# Patient Record
Sex: Female | Born: 1940 | Race: White | Hispanic: No | Marital: Married | Smoking: Former smoker
Health system: Southern US, Community
[De-identification: ages and names within clinical notes are randomized; demographics above are authoritative.]

## PROBLEM LIST (undated history)

## (undated) DIAGNOSIS — K2100 Gastro-esophageal reflux disease with esophagitis, without bleeding: Secondary | ICD-10-CM

## (undated) DIAGNOSIS — T8859XA Other complications of anesthesia, initial encounter: Secondary | ICD-10-CM

## (undated) DIAGNOSIS — M2550 Pain in unspecified joint: Secondary | ICD-10-CM

## (undated) DIAGNOSIS — E559 Vitamin D deficiency, unspecified: Secondary | ICD-10-CM

## (undated) DIAGNOSIS — Z8719 Personal history of other diseases of the digestive system: Secondary | ICD-10-CM

## (undated) DIAGNOSIS — R05 Cough: Secondary | ICD-10-CM

## (undated) DIAGNOSIS — M549 Dorsalgia, unspecified: Secondary | ICD-10-CM

## (undated) DIAGNOSIS — T4145XA Adverse effect of unspecified anesthetic, initial encounter: Secondary | ICD-10-CM

## (undated) DIAGNOSIS — J189 Pneumonia, unspecified organism: Secondary | ICD-10-CM

## (undated) DIAGNOSIS — R079 Chest pain, unspecified: Secondary | ICD-10-CM

## (undated) DIAGNOSIS — R7303 Prediabetes: Secondary | ICD-10-CM

## (undated) DIAGNOSIS — M1990 Unspecified osteoarthritis, unspecified site: Secondary | ICD-10-CM

## (undated) DIAGNOSIS — M65979 Unspecified synovitis and tenosynovitis, unspecified ankle and foot: Secondary | ICD-10-CM

## (undated) DIAGNOSIS — M659 Synovitis and tenosynovitis, unspecified: Secondary | ICD-10-CM

## (undated) DIAGNOSIS — F329 Major depressive disorder, single episode, unspecified: Secondary | ICD-10-CM

## (undated) DIAGNOSIS — I701 Atherosclerosis of renal artery: Secondary | ICD-10-CM

## (undated) DIAGNOSIS — I2510 Atherosclerotic heart disease of native coronary artery without angina pectoris: Secondary | ICD-10-CM

## (undated) DIAGNOSIS — IMO0001 Reserved for inherently not codable concepts without codable children: Secondary | ICD-10-CM

## (undated) DIAGNOSIS — E039 Hypothyroidism, unspecified: Secondary | ICD-10-CM

## (undated) DIAGNOSIS — I10 Essential (primary) hypertension: Secondary | ICD-10-CM

## (undated) DIAGNOSIS — Z8489 Family history of other specified conditions: Secondary | ICD-10-CM

## (undated) DIAGNOSIS — E669 Obesity, unspecified: Secondary | ICD-10-CM

## (undated) DIAGNOSIS — H9190 Unspecified hearing loss, unspecified ear: Secondary | ICD-10-CM

## (undated) DIAGNOSIS — D696 Thrombocytopenia, unspecified: Secondary | ICD-10-CM

## (undated) DIAGNOSIS — R5383 Other fatigue: Secondary | ICD-10-CM

## (undated) DIAGNOSIS — M069 Rheumatoid arthritis, unspecified: Secondary | ICD-10-CM

## (undated) DIAGNOSIS — R011 Cardiac murmur, unspecified: Secondary | ICD-10-CM

## (undated) DIAGNOSIS — G4700 Insomnia, unspecified: Secondary | ICD-10-CM

## (undated) DIAGNOSIS — D649 Anemia, unspecified: Secondary | ICD-10-CM

## (undated) DIAGNOSIS — K219 Gastro-esophageal reflux disease without esophagitis: Secondary | ICD-10-CM

## (undated) DIAGNOSIS — R059 Cough, unspecified: Secondary | ICD-10-CM

## (undated) DIAGNOSIS — R413 Other amnesia: Secondary | ICD-10-CM

## (undated) DIAGNOSIS — M79673 Pain in unspecified foot: Secondary | ICD-10-CM

## (undated) DIAGNOSIS — F3289 Other specified depressive episodes: Secondary | ICD-10-CM

## (undated) DIAGNOSIS — E785 Hyperlipidemia, unspecified: Secondary | ICD-10-CM

## (undated) DIAGNOSIS — N3941 Urge incontinence: Secondary | ICD-10-CM

## (undated) DIAGNOSIS — E079 Disorder of thyroid, unspecified: Secondary | ICD-10-CM

## (undated) HISTORY — DX: Atherosclerotic heart disease of native coronary artery without angina pectoris: I25.10

## (undated) HISTORY — DX: Anemia, unspecified: D64.9

## (undated) HISTORY — DX: Urge incontinence: N39.41

## (undated) HISTORY — PX: HERNIA REPAIR: SHX51

## (undated) HISTORY — PX: CHOLECYSTECTOMY: SHX55

## (undated) HISTORY — DX: Reserved for inherently not codable concepts without codable children: IMO0001

## (undated) HISTORY — DX: Pain in unspecified foot: M79.673

## (undated) HISTORY — DX: Other specified depressive episodes: F32.89

## (undated) HISTORY — DX: Disorder of thyroid, unspecified: E07.9

## (undated) HISTORY — DX: Insomnia, unspecified: G47.00

## (undated) HISTORY — DX: Dorsalgia, unspecified: M54.9

## (undated) HISTORY — DX: Unspecified synovitis and tenosynovitis, unspecified ankle and foot: M65.979

## (undated) HISTORY — DX: Major depressive disorder, single episode, unspecified: F32.9

## (undated) HISTORY — DX: Unspecified osteoarthritis, unspecified site: M19.90

## (undated) HISTORY — DX: Thrombocytopenia, unspecified: D69.6

## (undated) HISTORY — DX: Pain in unspecified joint: M25.50

## (undated) HISTORY — DX: Prediabetes: R73.03

## (undated) HISTORY — DX: Hyperlipidemia, unspecified: E78.5

## (undated) HISTORY — PX: ABDOMINAL HYSTERECTOMY: SHX81

## (undated) HISTORY — DX: Cough: R05

## (undated) HISTORY — PX: CARDIAC CATHETERIZATION: SHX172

## (undated) HISTORY — DX: Obesity, unspecified: E66.9

## (undated) HISTORY — DX: Vitamin D deficiency, unspecified: E55.9

## (undated) HISTORY — DX: Chest pain, unspecified: R07.9

## (undated) HISTORY — DX: Cough, unspecified: R05.9

## (undated) HISTORY — DX: Gastro-esophageal reflux disease with esophagitis, without bleeding: K21.00

## (undated) HISTORY — DX: Atherosclerosis of renal artery: I70.1

## (undated) HISTORY — DX: Other amnesia: R41.3

## (undated) HISTORY — PX: TOTAL HIP ARTHROPLASTY: SHX124

## (undated) HISTORY — DX: Other fatigue: R53.83

## (undated) HISTORY — DX: Synovitis and tenosynovitis, unspecified: M65.9

## (undated) HISTORY — PX: BACK SURGERY: SHX140

## (undated) HISTORY — DX: Essential (primary) hypertension: I10

## (undated) HISTORY — PX: SPINE SURGERY: SHX786

## (undated) HISTORY — DX: Gastro-esophageal reflux disease with esophagitis: K21.0

---

## 2010-06-25 HISTORY — PX: JOINT REPLACEMENT: SHX530

## 2012-01-31 ENCOUNTER — Encounter: Payer: Self-pay | Admitting: Surgery

## 2012-02-08 ENCOUNTER — Encounter: Payer: Self-pay | Admitting: Surgery

## 2012-02-11 ENCOUNTER — Encounter: Payer: Self-pay | Admitting: Surgery

## 2012-02-11 ENCOUNTER — Ambulatory Visit (INDEPENDENT_AMBULATORY_CARE_PROVIDER_SITE_OTHER): Payer: Medicare HMO | Admitting: Surgery

## 2012-02-11 VITALS — BP 155/75 | HR 58 | Resp 16 | Ht 61.0 in | Wt 187.0 lb

## 2012-02-11 DIAGNOSIS — I701 Atherosclerosis of renal artery: Secondary | ICD-10-CM

## 2012-02-11 NOTE — Progress Notes (Signed)
Vascular and Vein Specialist of [facility]   Patient name: Jennifer Austin MRN: [id number] DOB: May 24, 1941 Sex: female   Referred by: Dr.  Shary Decamp  Reason for referral:  Chief Complaint  Patient presents with  . New Evaluation    renal artery stenosis/Dr. Myra Rude    HISTORY OF PRESENT ILLNESS: The patient comes in today for evaluation of left renal artery stenosis. During a workup for hip replacement surgery she was found to have a creatinine of 1.3 as well as a GFR of 40. The patient has a history of hypertension for several years. This has been relatively well controlled. She is also a diabetic which is diet controlled. The patient reports having had issues with her kidneys during pregnancy. She was told that she had an inflamed kidney with all of her children. The patient was sent for a renal artery duplex which revealed elevated velocities within the left renal artery. Peak systolic velocity was 313 cm/s with a renal ratio of 5.6. The resistive indices on the left is 0.74. The patient has a history of smoking but quit 12 years ago.  Past Medical History  Diagnosis Date  . Renal artery stenosis   . Arthralgia   . Back ache   . Obesity   . Chest pain, unspecified   . Reflux esophagitis   . CAD (coronary artery disease)   . Cough   . Depressive disorder, not elsewhere classified   . Hyperlipidemia     Dyslipidemia  . Hypertension   . Fatigue   . Foot pain   . Thyroid disease     Hypothyroidsm  . Insomnia   . Anemia     Iron deficiency  . Memory loss   . Myalgia and myositis, unspecified   . Arthritis     osteoarthritis  . Prediabetes   . Tenosynovitis of foot and ankle   . Thrombocytopenia, unspecified   . Urge incontinence   . Unspecified vitamin D deficiency     Past Surgical History  Procedure Date  . Total hip arthroplasty 4098,1191    X2    History   Social History  . Marital Status: Married    Spouse Name: N/A    Number of Children: N/A  . Years of  Education: N/A   Occupational History  . Not on file.   Social History Main Topics  . Smoking status: Former Smoker    Types: Cigarettes    Quit date: 02/11/2000  . Smokeless tobacco: Never Used  . Alcohol Use: No  . Drug Use: No  . Sexually Active: Not on file   Other Topics Concern  . Not on file   Social History Narrative  . No narrative on file    Family History  Problem Relation Age of Onset  . Diabetes Mother   . Heart disease Mother     before age 39  . Heart attack Mother   . Other Mother     amputation  . Cancer Sister   . Cancer Brother     Allergies as of 02/11/2012 - Review Complete 02/11/2012  Allergen Reaction Noted  . Morphine sulfate Nausea And Vomiting 01/31/2012    Current Outpatient Prescriptions on File Prior to Visit  Medication Sig Dispense Refill  . aspirin 81 MG tablet Take 81 mg by mouth daily.      . Cholecalciferol (VITAMIN D3) 3000 UNITS TABS Take 5,000 Units by mouth once a week.      . levothyroxine (SYNTHROID, LEVOTHROID) 137  MCG tablet Take 137 mcg by mouth daily.      Marland Kitchen LOSARTAN POTASSIUM-HCTZ PO Take 50 mg by mouth daily.      . Multiple Vitamin (MULTIVITAMIN) tablet Take 1 tablet by mouth daily.      . nitroGLYCERIN (NITROSTAT) 0.4 MG SL tablet Place 0.4 mg under the tongue every 5 (five) minutes as needed.      Marland Kitchen omeprazole (PRILOSEC) 20 MG capsule Take 20 mg by mouth daily.      . pravastatin (PRAVACHOL) 80 MG tablet Take 80 mg by mouth daily.      . sertraline (ZOLOFT) 50 MG tablet Take 50 mg by mouth daily.      Marland Kitchen zolpidem (AMBIEN) 10 MG tablet Take 10 mg by mouth at bedtime as needed.         REVIEW OF SYSTEMS: Cardiovascular: No chest pain, chest pressure, palpitations, orthopnea, or dyspnea on exertion. No claudication or rest pain,  No history of DVT or phlebitis. Pulmonary: No productive cough, asthma or wheezing. Neurologic: No weakness, paresthesias, aphasia, or amaurosis. No dizziness. Hematologic: No bleeding  problems or clotting disorders. Musculoskeletal: Left hip pain Gastrointestinal: No blood in stool or hematemesis Genitourinary: No dysuria or hematuria. Psychiatric:: No history of major depression. Integumentary: No rashes or ulcers. Constitutional: No fever or chills.  PHYSICAL EXAMINATION: General: The patient appears their stated age.  Vital signs are BP 155/75  Pulse 58  Resp 16  Ht 5\' 1"  (1.549 m)  Wt 187 lb (84.823 kg)  BMI 35.33 kg/m2  SpO2 98% HEENT:  No gross abnormalities Pulmonary: Respirations are non-labored Abdomen: Soft and non-tender . Aorta is not palpable. There are no abdominal bruits. Musculoskeletal: There are no major deformities.   Neurologic: No focal weakness or paresthesias are detected, Skin: There are no ulcer or rashes noted. Psychiatric: The patient has normal affect. Cardiovascular: There is a regular rate and rhythm without significant murmur appreciated. No carotid bruits. Palpable pedal pulses.  Diagnostic Studies: I have reviewed her outside renal artery ultrasound which shows greater than 60% left renal artery stenosis. Peak systolic velocity of 313 cm/s with a renal aortic ratio of 5.8. Resistive indices is 0.74  Outside Studies/Documentation Historical records were reviewed.  They showed left renal artery stenosis  Medication Changes: None  Assessment:  Left renal artery stenosis Plan: I discussed with the patient today the significance of the ultrasound findings. I discussed that the main reason to further evaluate and possibly treat this problem would be for uncontrolled hypertension and worsening renal function. The patient's blood pressure appears to be relatively well controlled. The last kidney function I have is a creatinine of 1.3 and a GFR of 40. By ultrasound with a resistive indices of 0.74 she does have some intrinsic kidney disease that would not be corrected with treatment of the renal artery stenosis. I suspect that with her  velocity profile that her stenosis is in the 60-70% range and without uncontrolled blood pressure and worsening renal function of the reluctant to stay in this area as the data on this situation is mixed. In addition if she were to have a stent placed it would require a minimum of 6 weeks to 3 months of Plavix. This would delay her hip replacement surgery. Therefore, at this point I feel she can proceed from my perspective with her hip replacement and have her renal artery stenosis followed up again in 6 months with a repeat ultrasound.     Jorge Ny, M.D.  [facility] of [place] Office: [phone] Pager:  [phone]

## 2012-02-24 HISTORY — PX: JOINT REPLACEMENT: SHX530

## 2012-08-13 ENCOUNTER — Encounter: Payer: Self-pay | Admitting: Neurosurgery

## 2012-08-14 ENCOUNTER — Other Ambulatory Visit (INDEPENDENT_AMBULATORY_CARE_PROVIDER_SITE_OTHER): Payer: Medicare Other | Admitting: *Deleted

## 2012-08-14 ENCOUNTER — Ambulatory Visit (INDEPENDENT_AMBULATORY_CARE_PROVIDER_SITE_OTHER): Payer: Medicare Other | Admitting: Neurosurgery

## 2012-08-14 ENCOUNTER — Encounter: Payer: Self-pay | Admitting: Neurosurgery

## 2012-08-14 VITALS — BP 126/63 | HR 53 | Resp 16 | Ht 61.0 in | Wt 180.0 lb

## 2012-08-14 DIAGNOSIS — I10 Essential (primary) hypertension: Secondary | ICD-10-CM

## 2012-08-14 DIAGNOSIS — I701 Atherosclerosis of renal artery: Secondary | ICD-10-CM

## 2012-08-14 NOTE — Progress Notes (Signed)
[facility] [place] [facility] Office Note  CC: Renal artery stenosis surveillance Referring Physician: Brabham  History of Present Illness: 72 year old female patient of Dr. Myra Gianotti followed for left renal artery stenosis. The patient denies any elevation in blood pressure and has no outlining renal studies for review. The patient denies any abdominal or back pain or claudication.  Past Medical History  Diagnosis Date  . Renal artery stenosis   . Arthralgia   . Back ache   . Obesity   . Chest pain, unspecified   . Reflux esophagitis   . CAD (coronary artery disease)   . Cough   . Depressive disorder, not elsewhere classified   . Hyperlipidemia     Dyslipidemia  . Hypertension   . Fatigue   . Foot pain   . Thyroid disease     Hypothyroidsm  . Insomnia   . Anemia     Iron deficiency  . Memory loss   . Myalgia and myositis, unspecified   . Arthritis     osteoarthritis  . Prediabetes   . Tenosynovitis of foot and ankle   . Thrombocytopenia, unspecified   . Urge incontinence   . Unspecified vitamin D deficiency     ROS: [x]  Positive   [ ]  Denies    General: [ ]  Weight loss, [ ]  Fever, [ ]  chills Neurologic: [ ]  Dizziness, [ ]  Blackouts, [ ]  Seizure [ ]  Stroke, [ ]  "Mini stroke", [ ]  Slurred speech, [ ]  Temporary blindness; [ ]  weakness in arms or legs, [ ]  Hoarseness Cardiac: [ ]  Chest pain/pressure, [ ]  Shortness of breath at rest [ ]  Shortness of breath with exertion, [ ]  Atrial fibrillation or irregular heartbeat Vascular: [ ]  Pain in legs with walking, [ ]  Pain in legs at rest, [ ]  Pain in legs at night,  [ ]  Non-healing ulcer, [ ]  Blood clot in vein/DVT,   Pulmonary: [ ]  Home oxygen, [ ]  Productive cough, [ ]  Coughing up blood, [ ]  Asthma,  [ ]  Wheezing Musculoskeletal:  [ ]  Arthritis, [ ]  Low back pain, [ ]  Joint pain Hematologic: [ ]  Easy Bruising, [ ]  Anemia; [ ]  Hepatitis Gastrointestinal: [ ]  Blood in stool, [ ]  Gastroesophageal  Reflux/heartburn, [ ]  Trouble swallowing Urinary: [ ]  chronic Kidney disease, [ ]  on HD - [ ]  MWF or [ ]  TTHS, [ ]  Burning with urination, [ ]  Difficulty urinating Skin: [ ]  Rashes, [ ]  Wounds Psychological: [ ]  Anxiety, [ ]  Depression   Social History History  Substance Use Topics  . Smoking status: Former Smoker    Types: Cigarettes    Quit date: 02/11/2000  . Smokeless tobacco: Never Used  . Alcohol Use: No    Family History Family History  Problem Relation Age of Onset  . Diabetes Mother     Right leg amputation  . Heart disease Mother     before age 42  . Heart attack Mother   . Other Mother     amputation  . Cancer Mother   . Cancer Sister   . Cancer Brother   . Kidney disease Father   . Alcohol abuse Father     Allergies  Allergen Reactions  . Morphine Sulfate Nausea And Vomiting    Current Outpatient Prescriptions  Medication Sig Dispense Refill  . aspirin 81 MG tablet Take 81 mg by mouth daily.      . Bimatoprost (LUMIGAN OP) Apply 1 drop to eye daily.      Marland Kitchen  brimonidine (ALPHAGAN) 0.2 % ophthalmic solution 1 drop 3 (three) times daily.      . Cholecalciferol (VITAMIN D3) 3000 UNITS TABS Take 5,000 Units by mouth once a week.      . levothyroxine (SYNTHROID, LEVOTHROID) 137 MCG tablet Take 137 mcg by mouth daily.      Marland Kitchen LOSARTAN POTASSIUM-HCTZ PO Take 50 mg by mouth daily.      . Multiple Vitamin (MULTIVITAMIN) tablet Take 1 tablet by mouth daily.      . nitroGLYCERIN (NITROSTAT) 0.4 MG SL tablet Place 0.4 mg under the tongue every 5 (five) minutes as needed.      Marland Kitchen omeprazole (PRILOSEC) 20 MG capsule Take 20 mg by mouth daily.      . pravastatin (PRAVACHOL) 80 MG tablet Take 80 mg by mouth daily.      . sertraline (ZOLOFT) 50 MG tablet Take 50 mg by mouth daily.      Marland Kitchen zolpidem (AMBIEN) 10 MG tablet Take 10 mg by mouth at bedtime as needed.      . Loteprednol Etabonate (LOTEMAX OP) Apply 1 drop to eye daily.      . timolol (BETIMOL) 0.5 % ophthalmic  solution 1 drop 2 (two) times daily.       No current facility-administered medications for this visit.    Physical Examination  Filed Vitals:   08/14/12 1006  BP: 126/63  Pulse: 53  Resp: 16    Body mass index is 34.03 kg/(m^2).  General:  WDWN in NAD Gait: Normal HEENT: WNL Eyes: Pupils equal Pulmonary: normal non-labored breathing , without Rales, rhonchi,  wheezing Cardiac: RRR, without  Murmurs, rubs or gallops; No carotid bruits Abdomen: soft, NT, no masses Skin: no rashes, ulcers noted Vascular Exam/Pulses: Palpable lower extremity pulses bilaterally  Extremities without ischemic changes, no Gangrene , no cellulitis; no open wounds;  Musculoskeletal: no muscle wasting or atrophy  Neurologic: A&O X 3; Appropriate Affect ; SENSATION: normal; MOTOR FUNCTION:  moving all extremities equally. Speech is fluent/normal  Non-Invasive Vascular Imaging: Renal artery duplex shows normal study on the right with renal artery origin proximal velocities up to 37 which is less than previous exam. R.AR is 1.38 on the left 0.99 on the right, this was reviewed with Dr. Arbie Cookey who recommends followup in one year with Dr. Myra Gianotti  ASSESSMENT/PLAN: Asymptomatic patient will followup in one year with a renal artery duplex and see Dr. Myra Gianotti. The patient's questions were encouraged and answered, she is in agreement with this plan.  Lauree Chandler ANP  Clinic M.D.: [place]

## 2012-08-15 NOTE — Addendum Note (Signed)
Addended by: Sharee Pimple on: 08/15/2012 09:42 AM   Modules accepted: Orders

## 2012-11-06 ENCOUNTER — Other Ambulatory Visit: Payer: Self-pay | Admitting: Neurosurgery

## 2012-11-06 ENCOUNTER — Encounter (HOSPITAL_COMMUNITY): Payer: Self-pay | Admitting: Pharmacy Technician

## 2012-11-07 ENCOUNTER — Encounter (HOSPITAL_COMMUNITY)
Admission: RE | Admit: 2012-11-07 | Discharge: 2012-11-07 | Disposition: A | Discharge status: Home / Self Care | Payer: Medicare Other | Source: Ambulatory Visit | Attending: Neurosurgery | Admitting: Neurosurgery

## 2012-11-07 ENCOUNTER — Encounter (HOSPITAL_COMMUNITY): Payer: Self-pay

## 2012-11-07 HISTORY — DX: Unspecified hearing loss, unspecified ear: H91.90

## 2012-11-07 HISTORY — DX: Cardiac murmur, unspecified: R01.1

## 2012-11-07 HISTORY — DX: Pneumonia, unspecified organism: J18.9

## 2012-11-07 HISTORY — DX: Gastro-esophageal reflux disease without esophagitis: K21.9

## 2012-11-07 HISTORY — DX: Adverse effect of unspecified anesthetic, initial encounter: T41.45XA

## 2012-11-07 HISTORY — DX: Family history of other specified conditions: Z84.89

## 2012-11-07 HISTORY — DX: Other complications of anesthesia, initial encounter: T88.59XA

## 2012-11-07 HISTORY — DX: Hypothyroidism, unspecified: E03.9

## 2012-11-07 HISTORY — DX: Personal history of other diseases of the digestive system: Z87.19

## 2012-11-07 LAB — CBC WITH DIFFERENTIAL/PLATELET
Eosinophils Absolute: 0.1 10*3/uL (ref 0.0–0.7)
Lymphocytes Relative: 24 % (ref 12–46)
Lymphs Abs: 2.1 10*3/uL (ref 0.7–4.0)
Neutro Abs: 6.4 10*3/uL (ref 1.7–7.7)
Neutrophils Relative %: 72 % (ref 43–77)
Platelets: 150 10*3/uL (ref 150–400)
RBC: 3.73 MIL/uL — ABNORMAL LOW (ref 3.87–5.11)
WBC: 8.9 10*3/uL (ref 4.0–10.5)

## 2012-11-07 LAB — BASIC METABOLIC PANEL
Calcium: 9.7 mg/dL (ref 8.4–10.5)
GFR calc Af Amer: 68 mL/min — ABNORMAL LOW (ref >90)
GFR calc non Af Amer: 58 mL/min — ABNORMAL LOW (ref >90)
Sodium: 141 mEq/L (ref 135–145)

## 2012-11-07 LAB — SURGICAL PCR SCREEN: Staphylococcus aureus: NEGATIVE

## 2012-11-07 NOTE — Progress Notes (Signed)
Pt s cardiologist is Dr Elvera Lennox  At [facility] in [place],  I faxed a request to his office requesting last office note, EKG, Stress Test, Echo.    I also sent a fax to [facility] requesting chest xray and anesthesia  Record from surgery performed in March 2014 ( pt stated that the anesthesia for this surgery was good , did not have any problems with it.

## 2012-11-07 NOTE — Pre-Procedure Instructions (Signed)
Jennifer Austin  11/07/2012   Your procedure is scheduled on:  MOnday, May 19th.  Report to [facility] at 5:30AM.  Call this number if you have problems the morning of surgery: [phone]     Remember:   Do not eat food or drink liquids after midnight.   Take these medicines the morning of surgery with A SIP OF WATER: levothyroxine (SYNTHROID, LEVOTHROID),  omeprazole (PRILOSEC), sertraline (ZOLOFT.  May take if needed: nitroGLYCERIN (NITROSTAT)   Do not wear jewelry, make-up or nail polish.  Do not wear lotions, powders, or perfumes. You may wear deodorant.  Do not shave 48 hours prior to surgery.   Do not bring valuables to the hospital.  Contacts, dentures or bridgework may not be worn into surgery.  Leave suitcase in the car. After surgery it may be brought to your room.  For patients admitted to the hospital, checkout time is 11:00 AM the day of discharge.   Patients discharged the day of surgery will not be allowed to drive home.  Name and phone number of your driver: -    Special Instructions: Shower using CHG 2 nights before surgery and the night before surgery.  If you shower the day of surgery use CHG.  Use special wash - you have one bottle of CHG for all showers.  You should use approximately 1/3 of the bottle for each shower.   Please read over the following fact sheets that you were given: Pain Booklet, Coughing and Deep Breathing and Surgical Site Infection Prevention

## 2012-11-09 MED ORDER — DEXAMETHASONE SODIUM PHOSPHATE 10 MG/ML IJ SOLN
10.0000 mg | INTRAMUSCULAR | Status: DC
  Filled 2012-11-09: qty 1

## 2012-11-09 MED ORDER — CEFAZOLIN SODIUM-DEXTROSE 2-3 GM-% IV SOLR
2.0000 g | INTRAVENOUS | Status: AC
  Administered 2012-11-10: 2 g via INTRAVENOUS
  Filled 2012-11-09: qty 50

## 2012-11-10 ENCOUNTER — Observation Stay (HOSPITAL_COMMUNITY)
Admission: RE | Admit: 2012-11-10 | Discharge: 2012-11-10 | Disposition: A | Discharge status: Home / Self Care | Payer: Medicare Other | Source: Ambulatory Visit | Attending: Neurosurgery | Admitting: Neurosurgery

## 2012-11-10 ENCOUNTER — Ambulatory Visit (HOSPITAL_COMMUNITY): Payer: Medicare Other | Admitting: Certified Registered"

## 2012-11-10 ENCOUNTER — Encounter (HOSPITAL_COMMUNITY): Payer: Self-pay | Admitting: Certified Registered"

## 2012-11-10 ENCOUNTER — Encounter (HOSPITAL_COMMUNITY): Payer: Self-pay | Admitting: *Deleted

## 2012-11-10 ENCOUNTER — Ambulatory Visit (HOSPITAL_COMMUNITY): Payer: Medicare Other

## 2012-11-10 ENCOUNTER — Encounter (HOSPITAL_COMMUNITY)
Admission: RE | Disposition: A | Discharge status: Home / Self Care | Payer: Self-pay | Source: Ambulatory Visit | Attending: Neurosurgery

## 2012-11-10 DIAGNOSIS — I10 Essential (primary) hypertension: Secondary | ICD-10-CM | POA: Insufficient documentation

## 2012-11-10 DIAGNOSIS — Z01812 Encounter for preprocedural laboratory examination: Secondary | ICD-10-CM | POA: Insufficient documentation

## 2012-11-10 DIAGNOSIS — R7309 Other abnormal glucose: Secondary | ICD-10-CM | POA: Insufficient documentation

## 2012-11-10 DIAGNOSIS — M5126 Other intervertebral disc displacement, lumbar region: Principal | ICD-10-CM | POA: Diagnosis present

## 2012-11-10 DIAGNOSIS — Z79899 Other long term (current) drug therapy: Secondary | ICD-10-CM | POA: Insufficient documentation

## 2012-11-10 HISTORY — PX: LUMBAR LAMINECTOMY/DECOMPRESSION MICRODISCECTOMY: SHX5026

## 2012-11-10 LAB — GLUCOSE, CAPILLARY: Glucose-Capillary: 129 mg/dL — ABNORMAL HIGH (ref 70–99)

## 2012-11-10 SURGERY — LUMBAR LAMINECTOMY/DECOMPRESSION MICRODISCECTOMY 1 LEVEL
Anesthesia: General | Site: Back | Laterality: Right | Wound class: Clean

## 2012-11-10 MED ORDER — BACITRACIN 50000 UNITS IM SOLR
INTRAMUSCULAR | Status: AC
  Filled 2012-11-10: qty 1

## 2012-11-10 MED ORDER — ACETAMINOPHEN 325 MG PO TABS: 650.0000 mg | ORAL_TABLET | ORAL | Status: DC | PRN

## 2012-11-10 MED ORDER — ACETAMINOPHEN 10 MG/ML IV SOLN: 1000.0000 mg | Freq: Once | INTRAVENOUS | Status: DC | PRN

## 2012-11-10 MED ORDER — EPHEDRINE SULFATE 50 MG/ML IJ SOLN
INTRAMUSCULAR | Status: DC | PRN
  Administered 2012-11-10 (×2): 5 mg via INTRAVENOUS

## 2012-11-10 MED ORDER — HEMOSTATIC AGENTS (NO CHARGE) OPTIME
TOPICAL | Status: DC | PRN
  Administered 2012-11-10: 1 via TOPICAL

## 2012-11-10 MED ORDER — ONDANSETRON HCL 4 MG/2ML IJ SOLN: 4.0000 mg | Freq: Once | INTRAMUSCULAR | Status: DC | PRN

## 2012-11-10 MED ORDER — BUPIVACAINE HCL (PF) 0.25 % IJ SOLN
INTRAMUSCULAR | Status: DC | PRN
  Administered 2012-11-10: 20 mL

## 2012-11-10 MED ORDER — MENTHOL 3 MG MT LOZG: 1.0000 | LOZENGE | OROMUCOSAL | Status: DC | PRN

## 2012-11-10 MED ORDER — CYCLOBENZAPRINE HCL 10 MG PO TABS: 10.0000 mg | ORAL_TABLET | Freq: Three times a day (TID) | ORAL | Status: DC | PRN

## 2012-11-10 MED ORDER — ZOLPIDEM TARTRATE 5 MG PO TABS: 5.0000 mg | ORAL_TABLET | Freq: Every evening | ORAL | Status: DC | PRN

## 2012-11-10 MED ORDER — HYDROCODONE-ACETAMINOPHEN 5-325 MG PO TABS: 1.0000 | ORAL_TABLET | ORAL | Status: DC | PRN

## 2012-11-10 MED ORDER — DEXAMETHASONE SODIUM PHOSPHATE 10 MG/ML IJ SOLN
INTRAMUSCULAR | Status: DC | PRN
  Administered 2012-11-10: 10 mg via INTRAVENOUS

## 2012-11-10 MED ORDER — KETOROLAC TROMETHAMINE 30 MG/ML IJ SOLN
15.0000 mg | Freq: Four times a day (QID) | INTRAMUSCULAR | Status: DC
  Administered 2012-11-10: 15 mg via INTRAVENOUS
  Filled 2012-11-10: qty 1

## 2012-11-10 MED ORDER — OXYCODONE-ACETAMINOPHEN 5-325 MG PO TABS: 1.0000 | ORAL_TABLET | ORAL | Status: DC | PRN

## 2012-11-10 MED ORDER — NEOSTIGMINE METHYLSULFATE 1 MG/ML IJ SOLN
INTRAMUSCULAR | Status: DC | PRN
  Administered 2012-11-10: 4 mg via INTRAVENOUS
  Administered 2012-11-10: 1 mg via INTRAVENOUS

## 2012-11-10 MED ORDER — PROPOFOL 10 MG/ML IV BOLUS
INTRAVENOUS | Status: DC | PRN
  Administered 2012-11-10: 150 mg via INTRAVENOUS

## 2012-11-10 MED ORDER — GLYCOPYRROLATE 0.2 MG/ML IJ SOLN
INTRAMUSCULAR | Status: DC | PRN
  Administered 2012-11-10: 0.2 mg via INTRAVENOUS
  Administered 2012-11-10: 0.6 mg via INTRAVENOUS

## 2012-11-10 MED ORDER — LACTATED RINGERS IV SOLN
INTRAVENOUS | Status: DC | PRN
  Administered 2012-11-10: 08:00:00 via INTRAVENOUS

## 2012-11-10 MED ORDER — SENNA 8.6 MG PO TABS: 1.0000 | ORAL_TABLET | Freq: Two times a day (BID) | ORAL | Status: DC

## 2012-11-10 MED ORDER — LOSARTAN POTASSIUM 50 MG PO TABS
50.0000 mg | ORAL_TABLET | Freq: Every day | ORAL | Status: DC
  Administered 2012-11-10: 50 mg via ORAL
  Filled 2012-11-10: qty 1

## 2012-11-10 MED ORDER — ZOLPIDEM TARTRATE 5 MG PO TABS: 10.0000 mg | ORAL_TABLET | Freq: Every evening | ORAL | Status: DC | PRN

## 2012-11-10 MED ORDER — ALBUTEROL SULFATE (5 MG/ML) 0.5% IN NEBU
INHALATION_SOLUTION | RESPIRATORY_TRACT | Status: AC
  Administered 2012-11-10: 2.5 mg via RESPIRATORY_TRACT
  Filled 2012-11-10: qty 0.5

## 2012-11-10 MED ORDER — LIDOCAINE HCL (CARDIAC) 20 MG/ML IV SOLN
INTRAVENOUS | Status: DC | PRN
  Administered 2012-11-10: 60 mg via INTRAVENOUS

## 2012-11-10 MED ORDER — SODIUM CHLORIDE 0.9 % IR SOLN
Status: DC | PRN
  Administered 2012-11-10: 08:00:00

## 2012-11-10 MED ORDER — ONDANSETRON HCL 4 MG/2ML IJ SOLN
INTRAMUSCULAR | Status: DC | PRN
  Administered 2012-11-10: 4 mg via INTRAVENOUS

## 2012-11-10 MED ORDER — THROMBIN 5000 UNITS EX SOLR
CUTANEOUS | Status: DC | PRN
  Administered 2012-11-10: 5000 [IU] via TOPICAL

## 2012-11-10 MED ORDER — FOLIC ACID 1 MG PO TABS
1.0000 mg | ORAL_TABLET | Freq: Every day | ORAL | Status: DC
  Administered 2012-11-10: 1 mg via ORAL
  Filled 2012-11-10: qty 1

## 2012-11-10 MED ORDER — HYDROMORPHONE HCL PF 1 MG/ML IJ SOLN: 0.2500 mg | INTRAMUSCULAR | Status: DC | PRN

## 2012-11-10 MED ORDER — HYDROMORPHONE HCL PF 1 MG/ML IJ SOLN: 0.5000 mg | INTRAMUSCULAR | Status: DC | PRN

## 2012-11-10 MED ORDER — SODIUM CHLORIDE 0.9 % IJ SOLN
3.0000 mL | Freq: Two times a day (BID) | INTRAMUSCULAR | Status: DC
  Administered 2012-11-10: 3 mL via INTRAVENOUS

## 2012-11-10 MED ORDER — ARTIFICIAL TEARS OP OINT
TOPICAL_OINTMENT | OPHTHALMIC | Status: DC | PRN
  Administered 2012-11-10: 1 via OPHTHALMIC

## 2012-11-10 MED ORDER — LEVOTHYROXINE SODIUM 137 MCG PO TABS
137.0000 ug | ORAL_TABLET | Freq: Every day | ORAL | Status: DC
  Filled 2012-11-10: qty 1

## 2012-11-10 MED ORDER — METOPROLOL TARTRATE 1 MG/ML IV SOLN
INTRAVENOUS | Status: DC | PRN
  Administered 2012-11-10: 2.5 mg via INTRAVENOUS

## 2012-11-10 MED ORDER — CEFAZOLIN SODIUM 1-5 GM-% IV SOLN
1.0000 g | Freq: Three times a day (TID) | INTRAVENOUS | Status: DC
  Administered 2012-11-10: 1 g via INTRAVENOUS
  Filled 2012-11-10 (×3): qty 50

## 2012-11-10 MED ORDER — SODIUM CHLORIDE 0.9 % IV SOLN
INTRAVENOUS | Status: AC
  Filled 2012-11-10: qty 500

## 2012-11-10 MED ORDER — OXYBUTYNIN CHLORIDE ER 10 MG PO TB24
10.0000 mg | ORAL_TABLET | Freq: Every day | ORAL | Status: DC
  Administered 2012-11-10: 10 mg via ORAL
  Filled 2012-11-10: qty 1

## 2012-11-10 MED ORDER — PANTOPRAZOLE SODIUM 40 MG PO TBEC: 80.0000 mg | DELAYED_RELEASE_TABLET | Freq: Every day | ORAL | Status: DC

## 2012-11-10 MED ORDER — SIMVASTATIN 40 MG PO TABS
40.0000 mg | ORAL_TABLET | Freq: Every day | ORAL | Status: DC
  Filled 2012-11-10: qty 1

## 2012-11-10 MED ORDER — KETOROLAC TROMETHAMINE 30 MG/ML IJ SOLN
INTRAMUSCULAR | Status: DC | PRN
  Administered 2012-11-10: 30 mg via INTRAVENOUS

## 2012-11-10 MED ORDER — ROCURONIUM BROMIDE 100 MG/10ML IV SOLN
INTRAVENOUS | Status: DC | PRN
  Administered 2012-11-10: 50 mg via INTRAVENOUS

## 2012-11-10 MED ORDER — FENTANYL CITRATE 0.05 MG/ML IJ SOLN
INTRAMUSCULAR | Status: DC | PRN
  Administered 2012-11-10: 50 ug via INTRAVENOUS
  Administered 2012-11-10: 100 ug via INTRAVENOUS

## 2012-11-10 MED ORDER — PHENOL 1.4 % MT LIQD: 1.0000 | OROMUCOSAL | Status: DC | PRN

## 2012-11-10 MED ORDER — SODIUM CHLORIDE 0.9 % IJ SOLN: 3.0000 mL | INTRAMUSCULAR | Status: DC | PRN

## 2012-11-10 MED ORDER — ACETAMINOPHEN 650 MG RE SUPP: 650.0000 mg | RECTAL | Status: DC | PRN

## 2012-11-10 MED ORDER — ALUM & MAG HYDROXIDE-SIMETH 200-200-20 MG/5ML PO SUSP: 30.0000 mL | Freq: Four times a day (QID) | ORAL | Status: DC | PRN

## 2012-11-10 MED ORDER — NITROGLYCERIN 0.4 MG SL SUBL: 0.4000 mg | SUBLINGUAL_TABLET | SUBLINGUAL | Status: DC | PRN

## 2012-11-10 MED ORDER — MIDAZOLAM HCL 5 MG/5ML IJ SOLN
INTRAMUSCULAR | Status: DC | PRN
  Administered 2012-11-10 (×2): 1 mg via INTRAVENOUS

## 2012-11-10 MED ORDER — SERTRALINE HCL 50 MG PO TABS
50.0000 mg | ORAL_TABLET | Freq: Every day | ORAL | Status: DC
  Filled 2012-11-10: qty 1

## 2012-11-10 MED ORDER — ONDANSETRON HCL 4 MG/2ML IJ SOLN: 4.0000 mg | INTRAMUSCULAR | Status: DC | PRN

## 2012-11-10 SURGICAL SUPPLY — 54 items
BAG DECANTER FOR FLEXI CONT (MISCELLANEOUS) ×2 IMPLANT
BENZOIN TINCTURE PRP APPL 2/3 (GAUZE/BANDAGES/DRESSINGS) ×2 IMPLANT
BLADE SURG ROTATE 9660 (MISCELLANEOUS) IMPLANT
BRUSH SCRUB EZ PLAIN DRY (MISCELLANEOUS) ×2 IMPLANT
BUR CUTTER 7.0 ROUND (BURR) ×2 IMPLANT
CANISTER SUCTION 2500CC (MISCELLANEOUS) ×2 IMPLANT
CLOTH BEACON ORANGE TIMEOUT ST (SAFETY) ×2 IMPLANT
CONT SPEC 4OZ CLIKSEAL STRL BL (MISCELLANEOUS) ×2 IMPLANT
DECANTER SPIKE VIAL GLASS SM (MISCELLANEOUS) ×2 IMPLANT
DERMABOND ADHESIVE PROPEN (GAUZE/BANDAGES/DRESSINGS) ×1
DERMABOND ADVANCED (GAUZE/BANDAGES/DRESSINGS) ×1
DERMABOND ADVANCED .7 DNX12 (GAUZE/BANDAGES/DRESSINGS) ×1 IMPLANT
DERMABOND ADVANCED .7 DNX6 (GAUZE/BANDAGES/DRESSINGS) ×1 IMPLANT
DRAPE LAPAROTOMY 100X72X124 (DRAPES) ×2 IMPLANT
DRAPE MICROSCOPE ZEISS OPMI (DRAPES) ×2 IMPLANT
DRAPE POUCH INSTRU U-SHP 10X18 (DRAPES) ×2 IMPLANT
DRAPE PROXIMA HALF (DRAPES) IMPLANT
DRAPE SURG 17X23 STRL (DRAPES) ×4 IMPLANT
DURAPREP 26ML APPLICATOR (WOUND CARE) ×2 IMPLANT
ELECT REM PT RETURN 9FT ADLT (ELECTROSURGICAL) ×2
ELECTRODE REM PT RTRN 9FT ADLT (ELECTROSURGICAL) ×1 IMPLANT
GAUZE SPONGE 4X4 16PLY XRAY LF (GAUZE/BANDAGES/DRESSINGS) IMPLANT
GLOVE BIO SURGEON STRL SZ8 (GLOVE) ×2 IMPLANT
GLOVE BIOGEL PI IND STRL 7.0 (GLOVE) ×1 IMPLANT
GLOVE BIOGEL PI INDICATOR 7.0 (GLOVE) ×1
GLOVE ECLIPSE 8.5 STRL (GLOVE) ×2 IMPLANT
GLOVE EXAM NITRILE LRG STRL (GLOVE) ×4 IMPLANT
GLOVE EXAM NITRILE MD LF STRL (GLOVE) IMPLANT
GLOVE EXAM NITRILE XL STR (GLOVE) IMPLANT
GLOVE EXAM NITRILE XS STR PU (GLOVE) IMPLANT
GLOVE INDICATOR 8.5 STRL (GLOVE) ×2 IMPLANT
GLOVE SS BIOGEL STRL SZ 6.5 (GLOVE) ×2 IMPLANT
GLOVE SUPERSENSE BIOGEL SZ 6.5 (GLOVE) ×2
GOWN BRE IMP SLV AUR LG STRL (GOWN DISPOSABLE) ×2 IMPLANT
GOWN BRE IMP SLV AUR XL STRL (GOWN DISPOSABLE) ×4 IMPLANT
GOWN STRL REIN 2XL LVL4 (GOWN DISPOSABLE) IMPLANT
KIT BASIN OR (CUSTOM PROCEDURE TRAY) ×2 IMPLANT
KIT ROOM TURNOVER OR (KITS) ×2 IMPLANT
NEEDLE HYPO 22GX1.5 SAFETY (NEEDLE) ×2 IMPLANT
NEEDLE SPNL 22GX3.5 QUINCKE BK (NEEDLE) ×2 IMPLANT
NS IRRIG 1000ML POUR BTL (IV SOLUTION) ×2 IMPLANT
PACK LAMINECTOMY NEURO (CUSTOM PROCEDURE TRAY) ×2 IMPLANT
PAD ARMBOARD 7.5X6 YLW CONV (MISCELLANEOUS) ×6 IMPLANT
RUBBERBAND STERILE (MISCELLANEOUS) ×4 IMPLANT
SPONGE GAUZE 4X4 12PLY (GAUZE/BANDAGES/DRESSINGS) ×2 IMPLANT
SPONGE SURGIFOAM ABS GEL SZ50 (HEMOSTASIS) ×2 IMPLANT
STRIP CLOSURE SKIN 1/2X4 (GAUZE/BANDAGES/DRESSINGS) ×2 IMPLANT
SUT VIC AB 2-0 CT1 18 (SUTURE) ×2 IMPLANT
SUT VIC AB 3-0 SH 8-18 (SUTURE) ×2 IMPLANT
SYR 20ML ECCENTRIC (SYRINGE) ×2 IMPLANT
TAPE CLOTH SURG 4X10 WHT LF (GAUZE/BANDAGES/DRESSINGS) ×2 IMPLANT
TOWEL OR 17X24 6PK STRL BLUE (TOWEL DISPOSABLE) ×2 IMPLANT
TOWEL OR 17X26 10 PK STRL BLUE (TOWEL DISPOSABLE) ×2 IMPLANT
WATER STERILE IRR 1000ML POUR (IV SOLUTION) ×2 IMPLANT

## 2012-11-10 NOTE — Plan of Care (Signed)
Problem: Consults Goal: Diagnosis - Spinal Surgery Outcome: Completed/Met Date Met:  11/10/12 Lumbar Laminectomy (Complex)

## 2012-11-10 NOTE — Progress Notes (Signed)
Pt. coing of not being able to breath, CRNA gave ALbuteral treatment,pts sats 95, Dr, Noreene Larsson aware

## 2012-11-10 NOTE — Transfer of Care (Signed)
Immediate Anesthesia Transfer of Care Note  Patient: Jennifer Austin  Procedure(s) Performed: Procedure(s) with comments: Right Lumbar Four Five Microdiskectomy (Right) - LUMBAR LAMINECTOMY/DECOMPRESSION MICRODISCECTOMY 1 LEVEL  Patient Location: PACU  Anesthesia Type:General  Level of Consciousness: awake, alert  and oriented  Airway & Oxygen Therapy: Patient connected to face mask oxygen  Post-op Assessment: Report given to PACU RN  Post vital signs: stable  Complications: No apparent anesthesia complications

## 2012-11-10 NOTE — Progress Notes (Signed)
Pt. Alert and oriented,follows simple instructions, denies pain. Incision area without swelling, redness or S/S of infection. Voiding adequate clear yellow urine. Moving all extremities well and vitals stable and documented. Lumbar surgery notes instructions given to patient and family member for home safety and precautions. Pt. and family stated understanding of instructions given.  

## 2012-11-10 NOTE — Op Note (Signed)
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  Surgeon:Jessicca Stitzer A.Corrin Hingle, M.D.  Asst. Surgeon: None  Anesthesia: General  Indication: 72 year old female severe back and right lower extremity pain paresthesias and weakness since with a right-sided L5 radiculopathy failing conservative management presents for a right-sided L4-5 laminotomy and microdiscectomy in hopes of improving her symptoms. Risks and benefits been explained patient wishes to proceed.  Operative note: After induction anesthesia, patient positioned prone on the Wilson frame and her purply padded. Lumbar region prepped and draped. Incision made overlying L45. Dissection performed the right x-ray taken and the L3-4 level was localized. Dissection redirected 1 level caudally and retractor placed. Laminotomy performed using high-speed drill and Kerrison rongeurs. Ligament flavum elevated and resected piecemeal fashion. Underlying thecal sac and L5 nerve root identified. Microscope brought field these were microdissection of the spinal canal. Epidermides plexus was coagulated and cut. Thecal sac and L5 nerve root gently mobilized retracted towards midline. Free disc herniation was encountered both superior and inferior to the disc space. This is dissected free and removed. The spaces and incised 15 blade in a rectangular fashion. Y. disc space cleanout was achieved using pituitary rongeurs operating theater rongeurs and Epstein curettes. All elements the disc herniation were completely resect. All loose are obvious he jammed his removed and interspace. This point a very thorough discectomy been achieved. There is no evidence of injury to thecal sac and nerve roots. Wounds that area with and bike solution. Gelfoam was placed topically for  hemostasis which Ascent be good. Microscope and retractor system were removed. Hemostasis of the muscle was achieved with electrocautery. Wounds and close in layers with Vicryl sutures. Steri-Strips and sterile dressing were applied. There were no apparent complications. Patient tolerated the procedure well and she returns to the recovery room postop.

## 2012-11-10 NOTE — Progress Notes (Signed)
Pt. Much calmer with sats 100 at this time

## 2012-11-10 NOTE — Progress Notes (Signed)
Pt. Able to lift head off bed, pt.states she feels normal now

## 2012-11-10 NOTE — Anesthesia Procedure Notes (Signed)
Procedure Name: Intubation Date/Time: 11/10/2012 8:03 AM Performed by: Ellin Goodie Pre-anesthesia Checklist: Patient identified, Emergency Drugs available, Suction available, Patient being monitored and Timeout performed Patient Re-evaluated:Patient Re-evaluated prior to inductionOxygen Delivery Method: Circle system utilized Preoxygenation: Pre-oxygenation with 100% oxygen Intubation Type: IV induction Ventilation: Mask ventilation without difficulty and Oral airway inserted - appropriate to patient size Laryngoscope Size: Mac and 3 Grade View: Grade I Tube type: Oral Tube size: 7.5 mm Number of attempts: 1 Airway Equipment and Method: Stylet Placement Confirmation: ETT inserted through vocal cords under direct vision,  positive ETCO2 and breath sounds checked- equal and bilateral Secured at: 22 cm Tube secured with: Tape Dental Injury: Teeth and Oropharynx as per pre-operative assessment  Comments: Easy atraumatic intubation with MAC 3 blade.  Verified placement by Dr. Noreene Larsson.

## 2012-11-10 NOTE — Progress Notes (Signed)
Pt breathing much easier, cpap dced per Dr. Noreene Larsson

## 2012-11-10 NOTE — Anesthesia Postprocedure Evaluation (Signed)
  Anesthesia Post-op Note  Patient: Jennifer Austin  Procedure(s) Performed: Procedure(s) with comments: Right Lumbar Four Five Microdiskectomy (Right) - LUMBAR LAMINECTOMY/DECOMPRESSION MICRODISCECTOMY 1 LEVEL  Patient Location: PACU  Anesthesia Type:General  Level of Consciousness: awake, alert  and oriented  Airway and Oxygen Therapy: Patient Spontanous Breathing  Post-op Pain: mild  Post-op Assessment: Post-op Vital signs reviewed, Patient's Cardiovascular Status Stable, Respiratory Function Stable, Patent Airway and Pain level controlled  Post-op Vital Signs: stable  Complications: No apparent anesthesia complications

## 2012-11-10 NOTE — Preoperative (Signed)
Beta Blockers   Reason not to administer Beta Blockers:Not Applicable 

## 2012-11-10 NOTE — Discharge Summary (Signed)
Physician Discharge Summary  Patient ID: Jennifer Austin MRN: [id number] DOB/AGE: Jul 07, 1940 71 y.o.  Admit date: 11/10/2012 Discharge date: 11/10/2012  Admission Diagnoses:  Discharge Diagnoses:  Principal Problem:   HNP (herniated nucleus pulposus), lumbar   Discharged Condition: good  Hospital Course: Admitted the hospital where she underwent uncomplicated L4-5 laminotomy and microdiscectomy postoperative she is done well. Ready for discharge home.  Consults:   Significant Diagnostic Studies:   Treatments:   Discharge Exam: Blood pressure 105/68, pulse 52, temperature 98.9 F (37.2 C), temperature source Oral, resp. rate 16, SpO2 94.00%. Awake and alert oriented and appropriate cranial nerve function intact motor and sensory function extremities normal. Wound clean and dry. Chest abdomen benign.  Disposition: Final discharge disposition not confirmed   Future Appointments Provider Department Dept Phone   08/17/2013 9:00 AM [facility] [phone]   Eat a light meal the night before the exam but please avoid gaseous foods.   Nothing to eat or drink for at least 8 hours prior to the exam. No gum chewing or smoking the morning of the exam. Please take your morning medications with small sips of water, especially blood pressure medication. If you have several vascular lab exams and will see physician, please bring a snack with you.   08/17/2013 10:00 AM Nada Libman, MD [facility] [phone]       Medication List    TAKE these medications       cyclobenzaprine 10 MG tablet  Commonly known as:  FLEXERIL  Take 1 tablet (10 mg total) by mouth 3 (three) times daily as needed for muscle spasms.     folic acid 1 MG tablet  Commonly known as:  FOLVITE  Take 1 mg by mouth daily.     HYDROcodone-acetaminophen 5-325 MG per tablet  Commonly known as:  NORCO/VICODIN  Take 1-2 tablets by mouth every 4 (four)  hours as needed.     levothyroxine 137 MCG tablet  Commonly known as:  SYNTHROID, LEVOTHROID  Take 137 mcg by mouth daily.     losartan 50 MG tablet  Commonly known as:  COZAAR  Take 50 mg by mouth daily.     methotrexate 2.5 MG tablet  Take 30 mg by mouth once a week. wednesday     multivitamin tablet  Take 1 tablet by mouth daily.     nitroGLYCERIN 0.4 MG SL tablet  Commonly known as:  NITROSTAT  Place 0.4 mg under the tongue every 5 (five) minutes as needed for chest pain.     omeprazole 20 MG capsule  Commonly known as:  PRILOSEC  Take 20 mg by mouth daily.     oxybutynin 10 MG 24 hr tablet  Commonly known as:  DITROPAN-XL  Take 10 mg by mouth daily.     oxyCODONE-acetaminophen 5-325 MG per tablet  Commonly known as:  PERCOCET/ROXICET  Take 0.5 tablets by mouth every 4 (four) hours as needed for pain.     pravastatin 80 MG tablet  Commonly known as:  PRAVACHOL  Take 80 mg by mouth daily.     sertraline 50 MG tablet  Commonly known as:  ZOLOFT  Take 50 mg by mouth daily.     SIMBRINZA OP  Place 1 drop into both eyes 2 (two) times daily.     zolpidem 10 MG tablet  Commonly known as:  AMBIEN  Take 10 mg by mouth at bedtime as needed for sleep.  Follow-up Information   Follow up with Antonious Omahoney A, MD. Schedule an appointment as soon as possible for a visit in 1 week. (Ask for Lurena Joiner)    Contact information:   [address] [place] [address] [phone]       Signed: Temple Pacini 11/10/2012, 6:43 PM

## 2012-11-10 NOTE — Progress Notes (Signed)
Chest x-ray received from [facility]   No information received from [facility].  EKG done this AM

## 2012-11-10 NOTE — Anesthesia Preprocedure Evaluation (Addendum)
Anesthesia Evaluation  Patient identified by MRN, date of birth, ID band Patient awake    Reviewed: Allergy & Precautions, H&P , NPO status , Patient's Chart, lab work & pertinent test results, reviewed documented beta blocker date and time   History of Anesthesia Complications (+) Family history of anesthesia reaction  Airway Mallampati: II TM Distance: >3 FB     Dental  (+) Edentulous Upper, Edentulous Lower and Dental Advisory Given   Pulmonary pneumonia -, resolved,  breath sounds clear to auscultation        Cardiovascular hypertension, Pt. on medications + CAD and + Peripheral Vascular Disease + Valvular Problems/Murmurs Rhythm:Regular     Neuro/Psych Anxiety Depression  Neuromuscular disease    GI/Hepatic hiatal hernia, GERD-  Medicated and Controlled,  Endo/Other  Hypothyroidism   Renal/GU Renal InsufficiencyRenal disease     Musculoskeletal   Abdominal (+) + obese,  Abdomen: soft. Bowel sounds: normal.  Peds  Hematology   Anesthesia Other Findings   Reproductive/Obstetrics                      Anesthesia Physical Anesthesia Plan  ASA: III  Anesthesia Plan: General   Post-op Pain Management:    Induction: Intravenous  Airway Management Planned: Oral ETT  Additional Equipment:   Intra-op Plan:   Post-operative Plan: Extubation in OR  Informed Consent: I have reviewed the patients History and Physical, chart, labs and discussed the procedure including the risks, benefits and alternatives for the proposed anesthesia with the patient or authorized representative who has indicated his/her understanding and acceptance.   Dental advisory given  Plan Discussed with: CRNA and Anesthesiologist  Anesthesia Plan Comments: (Hnp L4-5 Htn GERD Cardiac cath 2009 per pt no intervention, uneventful L. THR 9/13 No angina  Plan GA with oral ETT)        Anesthesia Quick Evaluation

## 2012-11-10 NOTE — H&P (Signed)
Jennifer Austin is an 72 y.o. female.   Chief Complaint: Right leg pain HPI: 72 year old female with severe back and right lower extremity pain consistent with a right-sided L5 radiculopathy and failing conservative management. Workup demonstrates evidence of broad-based right word disc herniation L4-5 with compression the thecal sac and right-sided L5 nerve root. Patient presents now for laminotomy and microdiscectomy in hopes of improving her symptoms.  Past Medical History  Diagnosis Date  . Renal artery stenosis   . Arthralgia   . Back ache   . Obesity   . Chest pain, unspecified   . Reflux esophagitis   . CAD (coronary artery disease)   . Cough   . Depressive disorder, not elsewhere classified   . Hyperlipidemia     Dyslipidemia  . Hypertension   . Fatigue   . Foot pain   . Thyroid disease     Hypothyroidsm  . Insomnia   . Anemia     Iron deficiency  . Memory loss   . Myalgia and myositis, unspecified   . Arthritis     osteoarthritis  . Prediabetes   . Tenosynovitis of foot and ankle   . Thrombocytopenia, unspecified   . Urge incontinence   . Unspecified vitamin D deficiency   . Complication of anesthesia     "hard to wake up". Surgery in March 2014 was great.  . Family history of anesthesia complication     Daughter  . Hypothyroidism   . Pneumonia      last time  . Heart murmur   . GERD (gastroesophageal reflux disease)   . H/O hiatal hernia   . HOH (hard of hearing)     Past Surgical History  Procedure Laterality Date  . Total hip arthroplasty  1610,9604    X2- Right x2 last time 3/21012  . Joint replacement  2012    Right Hip  . Joint replacement  Sept. 2013    Left Hip  . Cardiac catheterization    . Hernia repair    . Abdominal hysterectomy    . Cholecystectomy    . Back surgery      Family History  Problem Relation Age of Onset  . Diabetes Mother     Right leg amputation  . Heart disease Mother     before age 25  . Heart attack Mother   .  Other Mother     amputation  . Cancer Mother   . Cancer Sister   . Cancer Brother   . Kidney disease Father   . Alcohol abuse Father    Social History:  reports that she quit smoking about 12 years ago. Her smoking use included Cigarettes. She smoked 0.00 packs per day for 38 years. She has never used smokeless tobacco. She reports that she does not drink alcohol or use illicit drugs.  Allergies:  Allergies  Allergen Reactions  . Morphine Sulfate Nausea And Vomiting  . Tape Rash    Blood blisters    Medications Prior to Admission  Medication Sig Dispense Refill  . Brinzolamide-Brimonidine (SIMBRINZA OP) Place 1 drop into both eyes 2 (two) times daily.      . folic acid (FOLVITE) 1 MG tablet Take 1 mg by mouth daily.      Marland Kitchen levothyroxine (SYNTHROID, LEVOTHROID) 137 MCG tablet Take 137 mcg by mouth daily.      Marland Kitchen losartan (COZAAR) 50 MG tablet Take 50 mg by mouth daily.      . methotrexate 2.5 MG tablet  Take 30 mg by mouth once a week. wednesday      . Multiple Vitamin (MULTIVITAMIN) tablet Take 1 tablet by mouth daily.      Marland Kitchen omeprazole (PRILOSEC) 20 MG capsule Take 20 mg by mouth daily.      Marland Kitchen oxybutynin (DITROPAN-XL) 10 MG 24 hr tablet Take 10 mg by mouth daily.      Marland Kitchen oxyCODONE-acetaminophen (PERCOCET/ROXICET) 5-325 MG per tablet Take 0.5 tablets by mouth every 4 (four) hours as needed for pain.      . pravastatin (PRAVACHOL) 80 MG tablet Take 80 mg by mouth daily.      . sertraline (ZOLOFT) 50 MG tablet Take 50 mg by mouth daily.      Marland Kitchen zolpidem (AMBIEN) 10 MG tablet Take 10 mg by mouth at bedtime as needed for sleep.       . nitroGLYCERIN (NITROSTAT) 0.4 MG SL tablet Place 0.4 mg under the tongue every 5 (five) minutes as needed for chest pain.         No results found for this or any previous visit (from the past 48 hour(s)). No results found.  Review of Systems  Constitutional: Negative.   HENT: Negative.   Eyes: Negative.   Respiratory: Negative.   Cardiovascular:  Negative.   Gastrointestinal: Negative.   Genitourinary: Negative.   Musculoskeletal: Negative.   Skin: Negative.   Neurological: Negative.   Endo/Heme/Allergies: Negative.   Psychiatric/Behavioral: Negative.     Blood pressure 140/58, pulse 52, temperature 98.1 F (36.7 C), temperature source Oral, resp. rate 20, SpO2 97.00%. Physical Exam  Constitutional: She is oriented to person, place, and time. She appears well-developed and well-nourished.  HENT:  Head: Normocephalic and atraumatic.  Right Ear: External ear normal.  Left Ear: External ear normal.  Nose: Nose normal.  Mouth/Throat: Oropharynx is clear and moist.  Eyes: Conjunctivae and EOM are normal. Pupils are equal, round, and reactive to light. Right eye exhibits no discharge. Left eye exhibits no discharge.  Neck: Normal range of motion. Neck supple. No tracheal deviation present. No thyromegaly present.  Cardiovascular: Normal rate, regular rhythm, normal heart sounds and intact distal pulses.   Respiratory: Effort normal and breath sounds normal. No respiratory distress. She has no wheezes.  GI: Soft. Bowel sounds are normal. She exhibits no distension. There is no tenderness.  Musculoskeletal: Normal range of motion. She exhibits no edema and no tenderness.  Neurological: She is alert and oriented to person, place, and time. She has normal reflexes. No cranial nerve deficit. Coordination normal.  Positive right straight leg raise. Right extensor hallucis longus 4/5 remainder of motor exam intact. Sensory exam with decreased sensation to pinprick and light touch right L5 dermatome. Achilles reflex is absent bilaterally otherwise reflexes normal.  Skin: Skin is warm and dry. No rash noted. No erythema. No pallor.  Psychiatric: She has a normal mood and affect. Her behavior is normal. Judgment and thought content normal.     Assessment/Plan Right L4-5 herniated pulposus with radiculopathy. Plan right L4-5 laminotomy and  microdiscectomy. Risks and benefits been explained. Patient wishes to proceed.  Jennifer Austin A 11/10/2012, 7:38 AM

## 2012-11-10 NOTE — Brief Op Note (Signed)
11/10/2012  9:12 AM  PATIENT:  Jennifer Austin  72 y.o. female  PRE-OPERATIVE DIAGNOSIS:  Herniated Nucleus Palposis  POST-OPERATIVE DIAGNOSIS:  Herniated Nucleus Palposis  PROCEDURE:  Procedure(s) with comments: Right Lumbar Four Five Microdiskectomy (Right) - LUMBAR LAMINECTOMY/DECOMPRESSION MICRODISCECTOMY 1 LEVEL  SURGEON:  Surgeon(s) and Role:    * Temple Pacini, MD - Primary  PHYSICIAN ASSISTANT:   ASSISTANTS: none   ANESTHESIA:   general  EBL:     BLOOD ADMINISTERED:none  DRAINS: none   LOCAL MEDICATIONS USED:  MARCAINE     SPECIMEN:  No Specimen  DISPOSITION OF SPECIMEN:  N/A  COUNTS:  YES  TOURNIQUET:  * No tourniquets in log *  DICTATION: .Dragon Dictation  PLAN OF CARE: Admit for overnight observation  PATIENT DISPOSITION:  PACU - hemodynamically stable.   Delay start of Pharmacological VTE agent (>24hrs) due to surgical blood loss or risk of bleeding: yes

## 2012-11-11 ENCOUNTER — Encounter (HOSPITAL_COMMUNITY): Payer: Self-pay | Admitting: Neurosurgery

## 2013-07-23 HISTORY — PX: EYE SURGERY: SHX253

## 2013-08-14 ENCOUNTER — Encounter: Payer: Self-pay | Admitting: Family

## 2013-08-17 ENCOUNTER — Ambulatory Visit (HOSPITAL_COMMUNITY)
Admission: RE | Admit: 2013-08-17 | Discharge: 2013-08-17 | Disposition: A | Discharge status: Home / Self Care | Payer: Medicare HMO | Source: Ambulatory Visit | Attending: Family | Admitting: Family

## 2013-08-17 ENCOUNTER — Ambulatory Visit (INDEPENDENT_AMBULATORY_CARE_PROVIDER_SITE_OTHER): Payer: Medicare HMO | Admitting: Family

## 2013-08-17 ENCOUNTER — Encounter: Payer: Self-pay | Admitting: Family

## 2013-08-17 VITALS — BP 121/66 | HR 49 | Resp 14 | Ht 61.0 in | Wt 162.0 lb

## 2013-08-17 DIAGNOSIS — I701 Atherosclerosis of renal artery: Secondary | ICD-10-CM

## 2013-08-17 NOTE — Patient Instructions (Addendum)
Renal Artery Stenosis Renal artery stenosis (RAS) is the narrowing of the artery that supplies blood to the kidney. If the narrowing is critical and the kidney does not get enough blood, hypertension (high blood pressure) can develop. This is called renal vascular hypertension (RVH). This is a common, uncommon cause of secondary hypertension. It does not usually happen until there is at least a 70% narrowing of the artery. Decreased blood flow through the renal artery causes the kidney to release increased amounts of a hormone. It is called renin. Renin is a strong blood pressure regulator. When it is high, it causes changes that lead to hypertension. Eventually the kidney not receiving enough blood may shrink in size and become less useful. The high blood pressure that is produced can eventually damage and destroy the remaining kidney. This is called hypertensive nephrosclerosis. If both kidneys fail, it will lead to chronic renal failure.  CAUSES  Most renal artery stenosis is caused by a hardening of the arteries (atherosclerosis). This is called Atherosclerotic Renal Artery Stenosis (AS-RAS). It is caused by a build-up of cholesterol (plaques) on the inner lining of the renal artery. A much less common cause is Fibromuscular Dysplasia (FMD). With it, there is an abnormality in the muscular lining of the renal artery. FMD-RAS occurs almost exclusively in women aged 33 to 83. It rarely affects African Americans or Asians.  SYMPTOMS  Often high blood pressure is discovered on a routine blood pressure check. It may be the only sign that something is wrong. Other problems that may occur are:  You may develop calf pain when walking. This is called intermittent claudication. It may be a sign of bad circulation in the legs.  Inability to use certain blood pressure pills such as angiotensin-I (ACE-I) inhibitors or angiotensin receptor blockers (ARB's). These could cause sudden drops in blood pressure with  worsening of kidney function.  More than three antihypertensive medications may be needed for blood pressure control.  New onset of high blood pressure if you are over 55. DIAGNOSIS  Your caregiver may find suggestions of this on exam if he finds bruits (like murmurs) on listening to your abdomen (belly) or the large arteries in your neck. Your caregiver may also suspect this there is a sudden worsening of your blood pressure when it has been well controlled and you are over age 19. Additional testing that may be done includes:  One diagnostic method used for renal artery stenosis (RAS) is to measure and compare the level of renin, (blood pressure-regulating hormone released by the kidneys), in the right to the left renal veins. If the amount of renin released by one-side is markedly higher than the other, this identifies a high renin-releasing kidney consistent with RAS.  FMD-RAS is often found on renal scan with ACE-inhibitor challenge, or ultrasound with Doppler.  FMD responds well to angioplasty and stenting. The results of stenting in FMD are usually long lasting. RISK FACTORS  Most renal artery stenosis is caused by a hardening of the arteries. This is called atherosclerosis. Other risk factors associated with the development of atherosclerotic RAS include the following:   Carotid artery disease.  Obesity.  High blood pressure.  Heredity.  Old age.  Fibromuscular dysplasia.  Diabetes mellitus.  Smoking.  Hardening of the arteries. TREATMENT   Renal vascular hypertension can be very severe. It can also be difficult to control.  Medication is used to control high blood pressure (hypertension). Blood pressure medications that directly affect the renin angiotensin pathway  can be used toe help control blood pressure. ACE inhibitors and angiotensin receptor blockers (ARB's) are often effective in patients with unilateral RAS. In some cases, patients with RAS are resistant to  these medications.  In patients with bilateral RAS, these medications must be used carefully. They may cause acute renal failure (ARF). If acute renal failure develops (if creatinine increases by more than 30%), the medication is discontinued. The patient is evaluated for bilateral RAS.  Angioplasty and stenting may be used to improve blood flow. The goal is to improve the circulation of blood flow to the kidney and prevent the release of excess renin, which can help to decrease blood pressure. This helps to prevent atrophy of the kidney. In general, patients with AS-RAS should have stenting done. This is because plasty by itself has a high incidence of re-stenosis.  Surgery to bypass the narrowing may be done. If the kidney with RAS has diminished in size or strength (atrophied ), surgical removal of the kidney may be advised. This is called nephrectomy. Document Released: 03/07/2005 Document Revised: 09/03/2011 Document Reviewed: 06/10/2008 [facility] Patient Information 2014 [place], [place].   If your blood pressure increases and remains above 140/90 for a few days, let your primary care provider know and call our office.

## 2013-08-17 NOTE — Progress Notes (Signed)
Established Renal Artery Stenosis  History of Present Illness  Jennifer Austin is a 73 y.o. (Feb 19, 1941) female patient of Dr. Myra Gianotti followed for left renal artery stenosis who presents with chief complaint: follow up on renal artery stenosis.   She states that her blood pressure has been good. She denies any history of stroke or TIA. She denies claudication symptoms. She takes a daily 81 mg ASA and a statin. She had right eye surgery to evaluate etiology of pressure in her right eye. She wants a referral to an orthopedist that specializes in hip issues, since her orthopedist left and she feels her bilateral foot numbness is related, she has had lumbar spine evaluated and surgery with no change.   Past Medical History  Diagnosis Date  . Renal artery stenosis   . Arthralgia   . Back ache   . Obesity   . Chest pain, unspecified   . Reflux esophagitis   . CAD (coronary artery disease)   . Cough   . Depressive disorder, not elsewhere classified   . Hyperlipidemia     Dyslipidemia  . Hypertension   . Fatigue   . Foot pain   . Thyroid disease     Hypothyroidsm  . Insomnia   . Anemia     Iron deficiency  . Memory loss   . Myalgia and myositis, unspecified   . Arthritis     osteoarthritis  . Prediabetes   . Tenosynovitis of foot and ankle   . Thrombocytopenia, unspecified   . Urge incontinence   . Unspecified vitamin D deficiency   . Complication of anesthesia     "hard to wake up". Surgery in March 2014 was great.  . Family history of anesthesia complication     Daughter  . Hypothyroidism   . Pneumonia      last time  . Heart murmur   . GERD (gastroesophageal reflux disease)   . H/O hiatal hernia   . HOH (hard of hearing)    Past Surgical History  Procedure Laterality Date  . Total hip arthroplasty  [id number]    X2- Right x2 last time 3/21012  . Joint replacement  2012    Right Hip  . Joint replacement  Sept. 2013    Left Hip  . Cardiac catheterization    .  Hernia repair    . Abdominal hysterectomy    . Cholecystectomy    . Back surgery    . Lumbar laminectomy/decompression microdiscectomy Right 11/10/2012    Procedure: Right Lumbar Four Five Microdiskectomy;  Surgeon: Temple Pacini, MD;  Location: [facility];  Service: Neurosurgery;  Laterality: Right;  LUMBAR LAMINECTOMY/DECOMPRESSION MICRODISCECTOMY 1 LEVEL  . Spine surgery    . Eye surgery Right Jan. 29,02015    eye Bx   History   Social History  . Marital Status: Married    Spouse Name: N/A    Number of Children: N/A  . Years of Education: N/A   Occupational History  . Not on file.   Social History Main Topics  . Smoking status: Former Smoker -- 38 years    Types: Cigarettes    Quit date: 02/11/2000  . Smokeless tobacco: Never Used  . Alcohol Use: No  . Drug Use: No  . Sexual Activity: Not on file   Other Topics Concern  . Not on file   Social History Narrative  . No narrative on file   Family History  Problem Relation Age of Onset  .  Diabetes Mother     Right leg amputation  . Heart disease Mother     before age 52  . Heart attack Mother   . Other Mother     amputation  . Cancer Mother   . Cancer Sister   . Cancer Brother   . Kidney disease Father   . Alcohol abuse Father    Current Outpatient Prescriptions on File Prior to Visit  Medication Sig Dispense Refill  . Brinzolamide-Brimonidine (SIMBRINZA OP) Place 1 drop into both eyes 2 (two) times daily.      . cyclobenzaprine (FLEXERIL) 10 MG tablet Take 1 tablet (10 mg total) by mouth 3 (three) times daily as needed for muscle spasms.  30 tablet  0  . folic acid (FOLVITE) 1 MG tablet Take 1 mg by mouth daily.      Marland Kitchen HYDROcodone-acetaminophen (NORCO/VICODIN) 5-325 MG per tablet Take 1-2 tablets by mouth every 4 (four) hours as needed.  60 tablet  1  . levothyroxine (SYNTHROID, LEVOTHROID) 137 MCG tablet Take 137 mcg by mouth daily.      Marland Kitchen losartan (COZAAR) 50 MG tablet Take 50 mg by mouth daily.      .  methotrexate 2.5 MG tablet Take 30 mg by mouth once a week. wednesday      . Multiple Vitamin (MULTIVITAMIN) tablet Take 1 tablet by mouth daily.      . nitroGLYCERIN (NITROSTAT) 0.4 MG SL tablet Place 0.4 mg under the tongue every 5 (five) minutes as needed for chest pain.       Marland Kitchen omeprazole (PRILOSEC) 20 MG capsule Take 20 mg by mouth daily.      Marland Kitchen oxybutynin (DITROPAN-XL) 10 MG 24 hr tablet Take 10 mg by mouth daily.      Marland Kitchen oxyCODONE-acetaminophen (PERCOCET/ROXICET) 5-325 MG per tablet Take 0.5 tablets by mouth every 4 (four) hours as needed for pain.      . pravastatin (PRAVACHOL) 80 MG tablet Take 80 mg by mouth daily.      . sertraline (ZOLOFT) 50 MG tablet Take 50 mg by mouth daily.      Marland Kitchen zolpidem (AMBIEN) 10 MG tablet Take 10 mg by mouth at bedtime as needed for sleep.        No current facility-administered medications on file prior to visit.   Allergies  Allergen Reactions  . Morphine Sulfate Nausea And Vomiting  . Tape Rash    Blood blisters    On ROS today: See HPI for pertinent positives and negatives.  Physical Examination  Filed Vitals:   08/17/13 1017  BP: 121/66  Pulse: 49  Resp: 14   Filed Weights   08/17/13 1017  Weight: 162 lb (73.483 kg)   Body mass index is 30.63 kg/(m^2).  General: A&O x 3, WD, Obese.  Pulmonary: Sym exp, good air movt, CTAB, no rales, rhonchi, or wheezing.   Cardiac: RRR, Nl S1, S2, no Murmurs, rubs or gallops.  Vascular: Vessel Right Left  Radial Palpable Palpable  Carotid  without bruit  without bruit  Aorta Not palpable N/A  Popliteal Not palpable Not palpable  PT Palpable Palpable  DP notPalpable Palpable   Gastrointestinal: soft, NTND, -G/R, - HSM, - masses, - CVAT B.  Musculoskeletal: M/S 5/5 throughout, Extremities without ischemic changes.  Neurologic: Pain and light touch intact in extremities, Motor exam as listed above  Non-Invasive Vascular Imaging  Renal Duplex (Date: 08/17/2013) RENAL ARTERY DUPLEX  EVALUATION    INDICATION: Atherosclerosis of the renal arteries  PREVIOUS INTERVENTION(S): N/A    DUPLEX EXAM:     AORTA Peak Systolic Velocity (cm/s): 71    RIGHT  LEFT   Peak Systolic Velocities (cm/s) Comments  Peak Systolic Velocities (cm/s) Comments  181  Renal Artery Origin/Proximal 225   125  Renal Artery Mid 248   89  Renal Artery Distal 121     Accessory Renal Artery (when present)    2.6  Renal / Aortic Ratio (RAR) 3.5  9.32 Kidney Size (cm) 9.21  Spontaneous and Phasic  Renal Vein Spontaneous and Phasic    ADDITIONAL FINDINGS:     IMPRESSION: Patent right renal artery with stenosis present of less than 60%. Abnormal turbulent flow present involving the left renal artery suggestive of greater than 60% stenosis.    Compared to the previous exam:  Increase in the bilateral renal aortic ratios since previous study most likely due to the significant difference in aortic velocities on 08/14/2012.     Medical Decision Making  Kearston Kohn is a 73 y.o. female who presents with: asymptomatic RAS, is normotensive. Her right renal artery is patent with stenosis present of less than 60%. Abnormal turbulent flow is present involving the left renal artery suggestive of greater than 60% stenosis. Usually no intervention for the renal arteries is necessary unless she becomes hypertensive.  Referral to ortho specializing in hip issues, at patient request.   Based on the patient's vascular studies and examination, and after discussing with Dr. Myra Gianotti, I have offered the patient: bilateral renal Duplex in 1 year.  I discussed in depth with the patient the nature of atherosclerosis, and emphasized the importance of maximal medical management including strict control of blood pressure, blood glucose, and lipid levels, antiplatelet agents, obtaining regular exercise, and cessation of smoking.  The patient is aware that without maximal medical management the underlying atherosclerotic  disease process will progress, limiting the benefit of any interventions.  Thank you for allowing [place] to participate in this patient's care.   Charisse March, RN, MSN, FNP-C [facility] Office: [phone]  Clinic MD: Myra Gianotti  08/17/2013, 9:27 AM

## 2013-08-17 NOTE — Addendum Note (Signed)
Addended by: Adria Dill L on: 08/17/2013 04:42 PM   Modules accepted: Orders

## 2014-08-23 ENCOUNTER — Ambulatory Visit: Payer: Self-pay | Admitting: Family

## 2014-08-23 ENCOUNTER — Other Ambulatory Visit (HOSPITAL_COMMUNITY): Payer: Self-pay

## 2015-08-09 ENCOUNTER — Other Ambulatory Visit: Payer: Self-pay | Admitting: Internal Medicine

## 2015-08-09 DIAGNOSIS — G44329 Chronic post-traumatic headache, not intractable: Secondary | ICD-10-CM

## 2015-08-17 ENCOUNTER — Ambulatory Visit
Admission: RE | Admit: 2015-08-17 | Discharge: 2015-08-17 | Disposition: A | Discharge status: Home / Self Care | Payer: Medicare HMO | Source: Ambulatory Visit | Attending: Internal Medicine | Admitting: Internal Medicine

## 2015-08-17 DIAGNOSIS — G44329 Chronic post-traumatic headache, not intractable: Secondary | ICD-10-CM

## 2015-08-17 MED ORDER — GADOBENATE DIMEGLUMINE 529 MG/ML IV SOLN
20.0000 mL | Freq: Once | INTRAVENOUS | Status: AC | PRN
Start: 2015-08-17 — End: 2015-08-17
  Administered 2015-08-17: 20 mL via INTRAVENOUS

## 2016-03-20 ENCOUNTER — Ambulatory Visit: Payer: Self-pay | Admitting: Physician Assistant

## 2016-03-30 ENCOUNTER — Encounter (HOSPITAL_COMMUNITY): Payer: Self-pay

## 2016-03-30 ENCOUNTER — Encounter (HOSPITAL_COMMUNITY)
Admission: RE | Admit: 2016-03-30 | Discharge: 2016-03-30 | Disposition: A | Discharge status: Home / Self Care | Payer: Medicare HMO | Source: Ambulatory Visit | Attending: Orthopedic Surgery | Admitting: Orthopedic Surgery

## 2016-03-30 DIAGNOSIS — Z01810 Encounter for preprocedural cardiovascular examination: Secondary | ICD-10-CM | POA: Insufficient documentation

## 2016-03-30 DIAGNOSIS — M48061 Spinal stenosis, lumbar region without neurogenic claudication: Secondary | ICD-10-CM | POA: Insufficient documentation

## 2016-03-30 DIAGNOSIS — Z01818 Encounter for other preprocedural examination: Secondary | ICD-10-CM | POA: Diagnosis present

## 2016-03-30 DIAGNOSIS — M5126 Other intervertebral disc displacement, lumbar region: Secondary | ICD-10-CM | POA: Diagnosis not present

## 2016-03-30 DIAGNOSIS — R001 Bradycardia, unspecified: Secondary | ICD-10-CM | POA: Insufficient documentation

## 2016-03-30 HISTORY — DX: Rheumatoid arthritis, unspecified: M06.9

## 2016-03-30 LAB — SURGICAL PCR SCREEN
MRSA, PCR: POSITIVE — AB
Staphylococcus aureus: POSITIVE — AB

## 2016-03-30 LAB — BASIC METABOLIC PANEL
ANION GAP: 9 (ref 5–15)
BUN: 12 mg/dL (ref 6–20)
CHLORIDE: 108 mmol/L (ref 101–111)
CO2: 24 mmol/L (ref 22–32)
Calcium: 9.4 mg/dL (ref 8.9–10.3)
Creatinine, Ser: 1 mg/dL (ref 0.44–1.00)
GFR, EST NON AFRICAN AMERICAN: 54 mL/min — AB (ref >60)
Glucose, Bld: 109 mg/dL — ABNORMAL HIGH (ref 65–99)
POTASSIUM: 4.1 mmol/L (ref 3.5–5.1)
SODIUM: 141 mmol/L (ref 135–145)

## 2016-03-30 LAB — CBC
HEMATOCRIT: 33.2 % — AB (ref 36.0–46.0)
Hemoglobin: 10.6 g/dL — ABNORMAL LOW (ref 12.0–15.0)
MCH: 33.7 pg (ref 26.0–34.0)
MCHC: 31.9 g/dL (ref 30.0–36.0)
MCV: 105.4 fL — AB (ref 78.0–100.0)
Platelets: 131 10*3/uL — ABNORMAL LOW (ref 150–400)
RBC: 3.15 MIL/uL — AB (ref 3.87–5.11)
RDW: 18.5 % — ABNORMAL HIGH (ref 11.5–15.5)
WBC: 5.5 10*3/uL (ref 4.0–10.5)

## 2016-03-30 NOTE — Pre-Procedure Instructions (Addendum)
Jennifer Austin  03/30/2016      [facility] [address] [place] - [address] [place] [address] Phone: [phone] Fax: [phone]    Your procedure is scheduled on 04/05/16.  Report to [facility] Admitting at 530 A.M.  Call this number if you have problems the morning of surgery:  [phone]   Remember:  Do not eat food or drink liquids after midnight.  Take these medicines the morning of surgery with A SIP OF WATER eye drops, ,nitro if needed ,oxybutynin(ditropan),sertraline(zoloft), oxycodone if needed  STOP all herbel meds, nsaids (aleve,naproxen,advil,ibuprofen) 5 days prior to surgery starting 03/29/16 including aspirin, vit D, calcium-D,folic acir,multi vit, no methotrexate after 03/28/16   Do not wear jewelry, make-up or nail polish.  Do not wear lotions, powders, or perfumes, or deoderant.  Do not shave 48 hours prior to surgery.  Men may shave face and neck.  Do not bring valuables to the hospital.  [facility] is not responsible for any belongings or valuables.  Contacts, dentures or bridgework may not be worn into surgery.  Leave your suitcase in the car.  After surgery it may be brought to your room.  For patients admitted to the hospital, discharge time will be determined by your treatment team.  Patients discharged the day of surgery will not be allowed to drive home.   Name and phone number of your driver:    Special instructions:   Special Instructions: [facility] - Preparing for Surgery  Before surgery, you can play an important role.  Because skin is not sterile, your skin needs to be as free of germs as possible.  You can reduce the number of germs on you skin by washing with CHG (chlorahexidine gluconate) soap before surgery.  CHG is an antiseptic cleaner which kills germs and bonds with the skin to continue killing germs even after washing.  Please DO NOT use if you have an allergy to CHG or  antibacterial soaps.  If your skin becomes reddened/irritated stop using the CHG and inform your nurse when you arrive at Short Stay.  Do not shave (including legs and underarms) for at least 48 hours prior to the first CHG shower.  You may shave your face.  Please follow these instructions carefully:   1.  Shower with CHG Soap the night before surgery and the morning of Surgery.  2.  If you choose to wash your hair, wash your hair first as usual with your normal shampoo.  3.  After you shampoo, rinse your hair and body thoroughly to remove the Shampoo.  4.  Use CHG as you would any other liquid soap.  You can apply chg directly  to the skin and wash gently with scrungie or a clean washcloth.  5.  Apply the CHG Soap to your body ONLY FROM THE NECK DOWN.  Do not use on open wounds or open sores.  Avoid contact with your eyes ears, mouth and genitals (private parts).  Wash genitals (private parts)       with your normal soap.  6.  Wash thoroughly, paying special attention to the area where your surgery will be performed.  7.  Thoroughly rinse your body with warm water from the neck down.  8.  DO NOT shower/wash with your normal soap after using and rinsing off the CHG Soap.  9.  Pat yourself dry with a clean towel.  10.  Wear clean pajamas.            11.  Place clean sheets on your bed the night of your first shower and do not sleep with pets.  Day of Surgery  Do not apply any lotions/deodorants the morning of surgery.  Please wear clean clothes to the hospital/surgery center.  Please read over the  fact sheets that you were given.

## 2016-04-02 ENCOUNTER — Encounter (HOSPITAL_COMMUNITY): Payer: Self-pay

## 2016-04-02 NOTE — Progress Notes (Signed)
Anesthesia Chart Review:  Pt is a 75 year old female scheduled for L2-3, L5-S1 decompression and discectomy on 04/05/2016 with Venita Lick, MD.   - PCP is Feliciana Rossetti, MD - Cardiologist is Belva Crome, MD, last office visit 03/28/16. Follow up prn recommended. Notes in care everywhere.   PMH includes:  CAD (mild), HTN, renal artery stenosis, heart murmur, hyperlipidemia, anemia, thrombocytopenia, hypothyroidism, GERD.  Hard of hearing. Former smoker. BMI 32.  S/p lumbar microdiskectomy 11/10/2012.   Medications include: ASA, aricept, levothyroxine, losartan, methotrexate, pravachol  Preoperative labs reviewed.  Hgb 10.6. Platelets 131  EKG 03/30/16: sinus bradycardia (47 bpm)  Cardiac cath 03/02/16 [facility], in care everywhere):  - LMCA: mild luminal irregularities < 20% - LAD: mild luminal irregularities < 20% - LCx: mild luminal irregularities < 20% - RCA: mild luminal irregularities < 30%  If no changes, I anticipate pt can proceed with surgery as scheduled.   Rica Mast, FNP-BC [facility] Stay Surgical [facility]/Anesthesiology Phone: [phone] 04/02/2016 1:19 PM

## 2016-04-05 ENCOUNTER — Inpatient Hospital Stay (HOSPITAL_COMMUNITY): Payer: MEDICARE | Admitting: Anesthesiology

## 2016-04-05 ENCOUNTER — Inpatient Hospital Stay (HOSPITAL_COMMUNITY): Payer: MEDICARE

## 2016-04-05 ENCOUNTER — Inpatient Hospital Stay (HOSPITAL_COMMUNITY): Payer: MEDICARE | Admitting: Emergency Medicine

## 2016-04-05 ENCOUNTER — Inpatient Hospital Stay (HOSPITAL_COMMUNITY)
Admission: RE | Admit: 2016-04-05 | Discharge: 2016-04-06 | DRG: 460 | Disposition: A | Discharge status: Home / Self Care | Payer: MEDICARE | Source: Ambulatory Visit | Attending: Orthopedic Surgery | Admitting: Orthopedic Surgery

## 2016-04-05 ENCOUNTER — Encounter (HOSPITAL_COMMUNITY): Payer: Self-pay | Admitting: Anesthesiology

## 2016-04-05 ENCOUNTER — Encounter (HOSPITAL_COMMUNITY)
Admission: RE | Disposition: A | Discharge status: Home / Self Care | Payer: Self-pay | Source: Ambulatory Visit | Attending: Orthopedic Surgery

## 2016-04-05 DIAGNOSIS — E785 Hyperlipidemia, unspecified: Secondary | ICD-10-CM | POA: Diagnosis present

## 2016-04-05 DIAGNOSIS — Z79899 Other long term (current) drug therapy: Secondary | ICD-10-CM | POA: Diagnosis not present

## 2016-04-05 DIAGNOSIS — K219 Gastro-esophageal reflux disease without esophagitis: Secondary | ICD-10-CM | POA: Diagnosis not present

## 2016-04-05 DIAGNOSIS — F329 Major depressive disorder, single episode, unspecified: Secondary | ICD-10-CM | POA: Diagnosis not present

## 2016-04-05 DIAGNOSIS — M069 Rheumatoid arthritis, unspecified: Secondary | ICD-10-CM | POA: Diagnosis not present

## 2016-04-05 DIAGNOSIS — M549 Dorsalgia, unspecified: Secondary | ICD-10-CM | POA: Diagnosis present

## 2016-04-05 DIAGNOSIS — I10 Essential (primary) hypertension: Secondary | ICD-10-CM | POA: Diagnosis not present

## 2016-04-05 DIAGNOSIS — E039 Hypothyroidism, unspecified: Secondary | ICD-10-CM | POA: Diagnosis not present

## 2016-04-05 DIAGNOSIS — R7303 Prediabetes: Secondary | ICD-10-CM | POA: Diagnosis present

## 2016-04-05 DIAGNOSIS — Z7982 Long term (current) use of aspirin: Secondary | ICD-10-CM

## 2016-04-05 DIAGNOSIS — I2510 Atherosclerotic heart disease of native coronary artery without angina pectoris: Secondary | ICD-10-CM | POA: Diagnosis present

## 2016-04-05 DIAGNOSIS — M5116 Intervertebral disc disorders with radiculopathy, lumbar region: Secondary | ICD-10-CM | POA: Diagnosis not present

## 2016-04-05 DIAGNOSIS — Z87891 Personal history of nicotine dependence: Secondary | ICD-10-CM

## 2016-04-05 DIAGNOSIS — H9190 Unspecified hearing loss, unspecified ear: Secondary | ICD-10-CM | POA: Diagnosis present

## 2016-04-05 DIAGNOSIS — G4700 Insomnia, unspecified: Secondary | ICD-10-CM | POA: Diagnosis not present

## 2016-04-05 DIAGNOSIS — Z419 Encounter for procedure for purposes other than remedying health state, unspecified: Secondary | ICD-10-CM

## 2016-04-05 DIAGNOSIS — M545 Low back pain: Secondary | ICD-10-CM | POA: Diagnosis present

## 2016-04-05 HISTORY — PX: DECOMPRESSIVE LUMBAR LAMINECTOMY LEVEL 2: SHX5792

## 2016-04-05 SURGERY — DECOMPRESSIVE LUMBAR LAMINECTOMY LEVEL 2
Anesthesia: General

## 2016-04-05 MED ORDER — EPHEDRINE SULFATE 50 MG/ML IJ SOLN
INTRAMUSCULAR | Status: DC | PRN
  Administered 2016-04-05 (×5): 5 mg via INTRAVENOUS

## 2016-04-05 MED ORDER — EPHEDRINE 5 MG/ML INJ
INTRAVENOUS | Status: AC
  Filled 2016-04-05: qty 10

## 2016-04-05 MED ORDER — ZOLPIDEM TARTRATE 5 MG PO TABS: 5.0000 mg | ORAL_TABLET | Freq: Every evening | ORAL | Status: DC | PRN

## 2016-04-05 MED ORDER — FENTANYL CITRATE (PF) 100 MCG/2ML IJ SOLN
INTRAMUSCULAR | Status: AC
  Filled 2016-04-05: qty 2

## 2016-04-05 MED ORDER — PHENOL 1.4 % MT LIQD
1.0000 | OROMUCOSAL | Status: DC | PRN
Start: 2016-04-05 — End: 2016-04-06

## 2016-04-05 MED ORDER — ROCURONIUM BROMIDE 10 MG/ML (PF) SYRINGE
PREFILLED_SYRINGE | INTRAVENOUS | Status: AC
Start: 2016-04-05 — End: 2016-04-05
  Filled 2016-04-05: qty 10

## 2016-04-05 MED ORDER — ALBUTEROL SULFATE HFA 108 (90 BASE) MCG/ACT IN AERS
INHALATION_SPRAY | RESPIRATORY_TRACT | Status: AC
  Filled 2016-04-05: qty 6.7

## 2016-04-05 MED ORDER — FENTANYL CITRATE (PF) 100 MCG/2ML IJ SOLN
INTRAMUSCULAR | Status: DC | PRN
  Administered 2016-04-05 (×8): 50 ug via INTRAVENOUS

## 2016-04-05 MED ORDER — SODIUM CHLORIDE 0.9 % IV SOLN: 250.0000 mL | INTRAVENOUS | Status: DC

## 2016-04-05 MED ORDER — ROPINIROLE HCL 1 MG PO TABS
0.5000 mg | ORAL_TABLET | Freq: Every day | ORAL | Status: DC
  Administered 2016-04-05: 0.5 mg via ORAL
  Filled 2016-04-05: qty 1

## 2016-04-05 MED ORDER — MIDAZOLAM HCL 2 MG/2ML IJ SOLN
INTRAMUSCULAR | Status: AC
Start: 2016-04-05 — End: 2016-04-05
  Filled 2016-04-05: qty 2

## 2016-04-05 MED ORDER — PRAVASTATIN SODIUM 40 MG PO TABS
80.0000 mg | ORAL_TABLET | Freq: Every day | ORAL | Status: DC
  Administered 2016-04-05 – 2016-04-06 (×2): 80 mg via ORAL
  Filled 2016-04-05 (×2): qty 2

## 2016-04-05 MED ORDER — LEFLUNOMIDE 20 MG PO TABS
10.0000 mg | ORAL_TABLET | Freq: Every day | ORAL | Status: DC
  Administered 2016-04-06: 10 mg via ORAL
  Filled 2016-04-05: qty 1

## 2016-04-05 MED ORDER — PROPOFOL 10 MG/ML IV BOLUS
INTRAVENOUS | Status: DC | PRN
  Administered 2016-04-05: 150 mg via INTRAVENOUS

## 2016-04-05 MED ORDER — CEFAZOLIN SODIUM-DEXTROSE 2-4 GM/100ML-% IV SOLN
2.0000 g | INTRAVENOUS | Status: AC
  Administered 2016-04-05: 2 g via INTRAVENOUS

## 2016-04-05 MED ORDER — METHOTREXATE 2.5 MG PO TABS: 30.0000 mg | ORAL_TABLET | ORAL | Status: DC

## 2016-04-05 MED ORDER — OXYBUTYNIN CHLORIDE ER 10 MG PO TB24
10.0000 mg | ORAL_TABLET | Freq: Every day | ORAL | Status: DC
  Administered 2016-04-05 – 2016-04-06 (×2): 10 mg via ORAL
  Filled 2016-04-05 (×2): qty 1

## 2016-04-05 MED ORDER — LOSARTAN POTASSIUM 50 MG PO TABS
50.0000 mg | ORAL_TABLET | Freq: Every day | ORAL | Status: DC
  Administered 2016-04-05 – 2016-04-06 (×2): 50 mg via ORAL
  Filled 2016-04-05 (×2): qty 1

## 2016-04-05 MED ORDER — METHOCARBAMOL 1000 MG/10ML IJ SOLN
500.0000 mg | Freq: Four times a day (QID) | INTRAVENOUS | Status: DC | PRN
  Filled 2016-04-05: qty 5

## 2016-04-05 MED ORDER — ERYTHROMYCIN 5 MG/GM OP OINT
TOPICAL_OINTMENT | Freq: Two times a day (BID) | OPHTHALMIC | Status: DC
  Administered 2016-04-05 – 2016-04-06 (×2): via OPHTHALMIC
  Filled 2016-04-05: qty 3.5

## 2016-04-05 MED ORDER — ARTIFICIAL TEARS OP OINT
TOPICAL_OINTMENT | OPHTHALMIC | Status: AC
  Filled 2016-04-05: qty 3.5

## 2016-04-05 MED ORDER — SERTRALINE HCL 50 MG PO TABS
50.0000 mg | ORAL_TABLET | Freq: Every day | ORAL | Status: DC
  Administered 2016-04-05 – 2016-04-06 (×2): 50 mg via ORAL
  Filled 2016-04-05 (×2): qty 1

## 2016-04-05 MED ORDER — NITROGLYCERIN 0.4 MG SL SUBL: 0.4000 mg | SUBLINGUAL_TABLET | SUBLINGUAL | Status: DC | PRN

## 2016-04-05 MED ORDER — MENTHOL 3 MG MT LOZG: 1.0000 | LOZENGE | OROMUCOSAL | Status: DC | PRN

## 2016-04-05 MED ORDER — THROMBIN 20000 UNITS EX SOLR
CUTANEOUS | Status: AC
  Filled 2016-04-05: qty 20000

## 2016-04-05 MED ORDER — ALBUTEROL SULFATE HFA 108 (90 BASE) MCG/ACT IN AERS
INHALATION_SPRAY | RESPIRATORY_TRACT | Status: DC | PRN
  Administered 2016-04-05: 2 via RESPIRATORY_TRACT

## 2016-04-05 MED ORDER — LIDOCAINE 2% (20 MG/ML) 5 ML SYRINGE
INTRAMUSCULAR | Status: AC
  Filled 2016-04-05: qty 5

## 2016-04-05 MED ORDER — THROMBIN 20000 UNITS EX SOLR
CUTANEOUS | Status: DC | PRN
  Administered 2016-04-05: 20000 [IU] via TOPICAL

## 2016-04-05 MED ORDER — SODIUM CHLORIDE 0.9% FLUSH: 3.0000 mL | INTRAVENOUS | Status: DC | PRN

## 2016-04-05 MED ORDER — BUPIVACAINE-EPINEPHRINE (PF) 0.25% -1:200000 IJ SOLN
INTRAMUSCULAR | Status: DC | PRN
  Administered 2016-04-05: 10 mL

## 2016-04-05 MED ORDER — ONDANSETRON HCL 4 MG/2ML IJ SOLN
INTRAMUSCULAR | Status: DC | PRN
  Administered 2016-04-05: 4 mg via INTRAVENOUS

## 2016-04-05 MED ORDER — CEFAZOLIN IN D5W 1 GM/50ML IV SOLN
1.0000 g | Freq: Three times a day (TID) | INTRAVENOUS | Status: AC
  Administered 2016-04-05 (×2): 1 g via INTRAVENOUS
  Filled 2016-04-05 (×2): qty 50

## 2016-04-05 MED ORDER — METHOCARBAMOL 500 MG PO TABS
500.0000 mg | ORAL_TABLET | Freq: Four times a day (QID) | ORAL | Status: DC | PRN
  Administered 2016-04-05 – 2016-04-06 (×3): 500 mg via ORAL
  Filled 2016-04-05 (×4): qty 1

## 2016-04-05 MED ORDER — SUGAMMADEX SODIUM 200 MG/2ML IV SOLN
INTRAVENOUS | Status: AC
  Filled 2016-04-05: qty 2

## 2016-04-05 MED ORDER — DONEPEZIL HCL 10 MG PO TABS
10.0000 mg | ORAL_TABLET | Freq: Every day | ORAL | Status: DC
  Administered 2016-04-05: 10 mg via ORAL
  Filled 2016-04-05: qty 1

## 2016-04-05 MED ORDER — CEFAZOLIN SODIUM-DEXTROSE 2-4 GM/100ML-% IV SOLN: 2.0000 g | INTRAVENOUS | Status: DC

## 2016-04-05 MED ORDER — LACTATED RINGERS IV SOLN
INTRAVENOUS | Status: DC | PRN
  Administered 2016-04-05 (×2): via INTRAVENOUS

## 2016-04-05 MED ORDER — 0.9 % SODIUM CHLORIDE (POUR BTL) OPTIME
TOPICAL | Status: DC | PRN
  Administered 2016-04-05 (×2): 1000 mL

## 2016-04-05 MED ORDER — ACETAMINOPHEN 10 MG/ML IV SOLN
1000.0000 mg | INTRAVENOUS | Status: AC
  Administered 2016-04-05: 1000 mg via INTRAVENOUS
  Filled 2016-04-05: qty 100

## 2016-04-05 MED ORDER — MORPHINE SULFATE (PF) 2 MG/ML IV SOLN: 1.0000 mg | INTRAVENOUS | Status: DC | PRN

## 2016-04-05 MED ORDER — OXYCODONE HCL 5 MG PO TABS
ORAL_TABLET | ORAL | Status: AC
  Filled 2016-04-05: qty 2

## 2016-04-05 MED ORDER — SUGAMMADEX SODIUM 200 MG/2ML IV SOLN
INTRAVENOUS | Status: DC | PRN
  Administered 2016-04-05: 175 mg via INTRAVENOUS

## 2016-04-05 MED ORDER — DEXAMETHASONE SODIUM PHOSPHATE 10 MG/ML IJ SOLN
INTRAMUSCULAR | Status: AC
  Filled 2016-04-05: qty 1

## 2016-04-05 MED ORDER — ROCURONIUM BROMIDE 100 MG/10ML IV SOLN
INTRAVENOUS | Status: DC | PRN
  Administered 2016-04-05: 50 mg via INTRAVENOUS
  Administered 2016-04-05: 10 mg via INTRAVENOUS

## 2016-04-05 MED ORDER — PROPOFOL 10 MG/ML IV BOLUS
INTRAVENOUS | Status: AC
  Filled 2016-04-05: qty 20

## 2016-04-05 MED ORDER — OXYCODONE HCL 5 MG PO TABS
10.0000 mg | ORAL_TABLET | ORAL | Status: DC | PRN
  Administered 2016-04-05 – 2016-04-06 (×4): 10 mg via ORAL
  Filled 2016-04-05 (×4): qty 2

## 2016-04-05 MED ORDER — ARTIFICIAL TEARS OP OINT
TOPICAL_OINTMENT | OPHTHALMIC | Status: DC | PRN
  Administered 2016-04-05: 1 via OPHTHALMIC

## 2016-04-05 MED ORDER — CEFAZOLIN SODIUM-DEXTROSE 2-4 GM/100ML-% IV SOLN
INTRAVENOUS | Status: AC
  Filled 2016-04-05: qty 100

## 2016-04-05 MED ORDER — TIMOLOL HEMIHYDRATE 0.5 % OP SOLN
1.0000 [drp] | Freq: Two times a day (BID) | OPHTHALMIC | Status: DC
  Administered 2016-04-05 – 2016-04-06 (×2): 1 [drp] via OPHTHALMIC
  Filled 2016-04-05: qty 10

## 2016-04-05 MED ORDER — LEVOTHYROXINE SODIUM 25 MCG PO TABS
137.0000 ug | ORAL_TABLET | Freq: Every day | ORAL | Status: DC
  Administered 2016-04-06: 137 ug via ORAL
  Filled 2016-04-05: qty 1

## 2016-04-05 MED ORDER — LIDOCAINE HCL (CARDIAC) 20 MG/ML IV SOLN
INTRAVENOUS | Status: DC | PRN
  Administered 2016-04-05: 70 mg via INTRAVENOUS

## 2016-04-05 MED ORDER — FENTANYL CITRATE (PF) 100 MCG/2ML IJ SOLN
INTRAMUSCULAR | Status: AC
  Filled 2016-04-05: qty 4

## 2016-04-05 MED ORDER — DEXAMETHASONE SODIUM PHOSPHATE 10 MG/ML IJ SOLN
INTRAMUSCULAR | Status: DC | PRN
  Administered 2016-04-05: 10 mg via INTRAVENOUS

## 2016-04-05 MED ORDER — FENTANYL CITRATE (PF) 100 MCG/2ML IJ SOLN: 25.0000 ug | INTRAMUSCULAR | Status: DC | PRN

## 2016-04-05 MED ORDER — ONDANSETRON HCL 4 MG/2ML IJ SOLN: 4.0000 mg | INTRAMUSCULAR | Status: DC | PRN

## 2016-04-05 MED ORDER — HEMOSTATIC AGENTS (NO CHARGE) OPTIME
TOPICAL | Status: DC | PRN
  Administered 2016-04-05: 1

## 2016-04-05 MED ORDER — LACTATED RINGERS IV SOLN
INTRAVENOUS | Status: DC
  Administered 2016-04-05: 500 mL via INTRAVENOUS
  Administered 2016-04-05: via INTRAVENOUS

## 2016-04-05 MED ORDER — SODIUM CHLORIDE 0.9% FLUSH: 3.0000 mL | Freq: Two times a day (BID) | INTRAVENOUS | Status: DC

## 2016-04-05 MED ORDER — ONDANSETRON HCL 4 MG/2ML IJ SOLN
INTRAMUSCULAR | Status: AC
  Filled 2016-04-05: qty 2

## 2016-04-05 MED ORDER — BUPIVACAINE-EPINEPHRINE 0.25% -1:200000 IJ SOLN
INTRAMUSCULAR | Status: AC
  Filled 2016-04-05: qty 1

## 2016-04-05 SURGICAL SUPPLY — 67 items
BNDG GAUZE ELAST 4 BULKY (GAUZE/BANDAGES/DRESSINGS) ×3 IMPLANT
BUR EGG ELITE 4.0 (BURR) ×2 IMPLANT
BUR EGG ELITE 4.0MM (BURR) ×1
CLOSURE STERI-STRIP 1/2X4 (GAUZE/BANDAGES/DRESSINGS) ×1
CLSR STERI-STRIP ANTIMIC 1/2X4 (GAUZE/BANDAGES/DRESSINGS) ×2 IMPLANT
COVER SURGICAL LIGHT HANDLE (MISCELLANEOUS) ×3 IMPLANT
DRAIN CHANNEL 15F RND FF W/TCR (WOUND CARE) ×3 IMPLANT
DRAPE C-ARM 42X72 X-RAY (DRAPES) IMPLANT
DRAPE POUCH INSTRU U-SHP 10X18 (DRAPES) ×3 IMPLANT
DRAPE SURG 17X11 SM STRL (DRAPES) ×3 IMPLANT
DRAPE U-SHAPE 47X51 STRL (DRAPES) ×3 IMPLANT
DRSG AQUACEL AG ADV 3.5X 6 (GAUZE/BANDAGES/DRESSINGS) ×3 IMPLANT
DRSG AQUACEL AG ADV 3.5X10 (GAUZE/BANDAGES/DRESSINGS) ×3 IMPLANT
DURAPREP 26ML APPLICATOR (WOUND CARE) ×3 IMPLANT
ELECT BLADE 4.0 EZ CLEAN MEGAD (MISCELLANEOUS) ×3
ELECT CAUTERY BLADE 6.4 (BLADE) ×3 IMPLANT
ELECT PENCIL ROCKER SW 15FT (MISCELLANEOUS) ×3 IMPLANT
ELECT REM PT RETURN 9FT ADLT (ELECTROSURGICAL) ×3
ELECTRODE BLDE 4.0 EZ CLN MEGD (MISCELLANEOUS) ×1 IMPLANT
ELECTRODE REM PT RTRN 9FT ADLT (ELECTROSURGICAL) ×1 IMPLANT
EVACUATOR SILICONE 100CC (DRAIN) ×3 IMPLANT
FLOSEAL (HEMOSTASIS) IMPLANT
GLOVE BIO SURGEON STRL SZ7 (GLOVE) ×3 IMPLANT
GLOVE BIOGEL PI IND STRL 6.5 (GLOVE) ×2 IMPLANT
GLOVE BIOGEL PI IND STRL 7.0 (GLOVE) ×2 IMPLANT
GLOVE BIOGEL PI IND STRL 8.5 (GLOVE) ×1 IMPLANT
GLOVE BIOGEL PI INDICATOR 6.5 (GLOVE) ×4
GLOVE BIOGEL PI INDICATOR 7.0 (GLOVE) ×4
GLOVE BIOGEL PI INDICATOR 8.5 (GLOVE) ×2
GLOVE SS BIOGEL STRL SZ 8.5 (GLOVE) ×4 IMPLANT
GLOVE SUPERSENSE BIOGEL SZ 8.5 (GLOVE) ×8
GOWN STRL REUS W/ TWL LRG LVL3 (GOWN DISPOSABLE) ×4 IMPLANT
GOWN STRL REUS W/TWL 2XL LVL3 (GOWN DISPOSABLE) ×3 IMPLANT
GOWN STRL REUS W/TWL LRG LVL3 (GOWN DISPOSABLE) ×8
KIT BASIN OR (CUSTOM PROCEDURE TRAY) ×3 IMPLANT
NEEDLE 22X1 1/2 (OR ONLY) (NEEDLE) ×3 IMPLANT
NEEDLE SPNL 18GX3.5 QUINCKE PK (NEEDLE) ×6 IMPLANT
NS IRRIG 1000ML POUR BTL (IV SOLUTION) ×3 IMPLANT
PACK LAMINECTOMY ORTHO (CUSTOM PROCEDURE TRAY) ×3 IMPLANT
PACK UNIVERSAL I (CUSTOM PROCEDURE TRAY) ×3 IMPLANT
PATTIES SURGICAL .5 X.5 (GAUZE/BANDAGES/DRESSINGS) IMPLANT
PATTIES SURGICAL .5 X1 (DISPOSABLE) ×3 IMPLANT
PUTTY BONE DBX 2.5 MIS (Bone Implant) ×3 IMPLANT
SPOGE SURGIFLO 8M (HEMOSTASIS) ×2
SPONGE LAP 4X18 X RAY DECT (DISPOSABLE) IMPLANT
SPONGE SURGIFLO 8M (HEMOSTASIS) ×1 IMPLANT
SPONGE SURGIFOAM ABS GEL 100 (HEMOSTASIS) ×3 IMPLANT
STAPLER VISISTAT 35W (STAPLE) IMPLANT
SURGIFLO W/THROMBIN 8M KIT (HEMOSTASIS) ×3 IMPLANT
SUT BONE WAX W31G (SUTURE) ×3 IMPLANT
SUT ETHILON 2 0 FS 18 (SUTURE) ×3 IMPLANT
SUT MON AB 3-0 SH 27 (SUTURE) ×2
SUT MON AB 3-0 SH27 (SUTURE) ×1 IMPLANT
SUT VIC AB 0 CT1 27 (SUTURE) ×4
SUT VIC AB 0 CT1 27XBRD ANBCTR (SUTURE) ×2 IMPLANT
SUT VIC AB 1 CT1 18XCR BRD 8 (SUTURE) ×1 IMPLANT
SUT VIC AB 1 CT1 27 (SUTURE) ×4
SUT VIC AB 1 CT1 27XBRD ANTBC (SUTURE) ×2 IMPLANT
SUT VIC AB 1 CT1 8-18 (SUTURE) ×2
SUT VIC AB 2-0 CT1 18 (SUTURE) ×12 IMPLANT
SUT VICRYL 0 UR6 27IN ABS (SUTURE) IMPLANT
SYR BULB IRRIGATION 50ML (SYRINGE) IMPLANT
SYR CONTROL 10ML LL (SYRINGE) ×6 IMPLANT
TOWEL OR 17X26 10 PK STRL BLUE (TOWEL DISPOSABLE) ×6 IMPLANT
TRAY FOLEY CATH 16FRSI W/METER (SET/KITS/TRAYS/PACK) ×3 IMPLANT
WATER STERILE IRR 1000ML POUR (IV SOLUTION) IMPLANT
YANKAUER SUCT BULB TIP NO VENT (SUCTIONS) ×3 IMPLANT

## 2016-04-05 NOTE — Brief Op Note (Signed)
04/05/2016  12:44 PM  PATIENT:  Jennifer Austin  75 y.o. female  PRE-OPERATIVE DIAGNOSIS:  LUMBAR DISC HERNIATION WITH STENOSIS  POST-OPERATIVE DIAGNOSIS:  LUMBAR DISC HERNIATION WITH STENOSIS  PROCEDURE:  Procedure(s): LEFT L2-3 DECOMPRESSION AND DISECTOMY AND RIGHT L5-S1 DISECTOMY AND DECOMPRESSION (N/A)  SURGEON:  Surgeon(s) and Role:    * Venita Lick, MD - Primary  PHYSICIAN ASSISTANT:   ASSISTANTS: Carmen Mayo   ANESTHESIA:   general  EBL:  Total I/O In: 2000 [I.V.:2000] Out: 600 [Urine:350; Blood:250]  BLOOD ADMINISTERED:none  DRAINS: none   LOCAL MEDICATIONS USED:  MARCAINE     SPECIMEN:  No Specimen  DISPOSITION OF SPECIMEN:  N/A  COUNTS:  YES  TOURNIQUET:  * No tourniquets in log *  DICTATION: .Other Dictation: Dictation Number [id number]  PLAN OF CARE: Admit to inpatient   PATIENT DISPOSITION:  PACU - hemodynamically stable.

## 2016-04-05 NOTE — H&P (Signed)
History of Present Illness The patient is a 75 year old female who comes in today for a preoperative History and Physical. The patient is scheduled for a Left L2-3 decompression and Discectomy, Right L5-S1 Discectomy and Decompression to be performed by Dr. Debria Garret D. Shon Baton, MD at [facility] on 04/05/2016 . Please see the hospital record for complete dictated history and physical. Pt has a hx of RA. methatrexate has been temporarily discontinued.  Additional reasons for visit:  Transition into care is described as the following: The patient is transitioning into care and a summary of care was reviewed.   Problem List/Past Medical  Lumbar radiculopathy, right (M54.16)  Problems Reconciled   Allergies  Morphine Sulfate (PF) *ANALGESICS - OPIOID*  Tape 1"X5yd *MEDICAL DEVICES AND SUPPLIES*  Allergies Reconciled   Family History Cancer  Brother, Sister. Cerebrovascular Accident  Father. Chronic Obstructive Lung Disease  Sister. Diabetes Mellitus  Mother. Drug / Alcohol Addiction  Brother, Father, Maternal Grandfather, Maternal Grandmother, Mother, Sister. Heart Disease  Mother. Osteoarthritis  Maternal Grandmother.  Social History Tobacco / smoke exposure  01/27/2016: no Children  5 or more Current work status  disabled Exercise  Exercises daily; does other Living situation  live with spouse Marital status  married Never consumed alcohol  01/27/2016: Never consumed alcohol No history of drug/alcohol rehab  Not under pain contract  Number of flights of stairs before winded  4-5 Tobacco use  Former smoker. 01/27/2016: smoke(d) 1 pack(s) per day uses less than 1/2 can(s) smokeless per week  Medication History  Methotrexate (2.5MG  Tablet, Oral) Active. Folic Acid (1MG  Tablet, Oral) Active. Betimol (0.5% Solution, Ophthalmic) Active. Simbrinza (1-0.2% Suspension, Ophthalmic) Active. Vigamox (0.5% Solution, Ophthalmic) Active. Oxybutynin  Chloride ER (5MG  Tablet ER 24HR, Oral) Active. Pravastatin Sodium (40MG  Tablet, Oral) Active. Acetaminophen (500MG  Tablet, Oral) Active. Hydrocodone-Acetaminophen (7.5-325MG  Tablet, Oral) Active. Erythromycin (5MG /GM Ointment, Ophthalmic) Active. Moxifloxacin HCl (400MG  Tablet, Oral) Active. VANCOMYCIN%25 opthalmic solution Active. Tobramycin (0.3% Solution, Ophthalmic) Active. Leflunomide (10MG  Tablet, Oral) Active. Timolol Maleate (0.5% Solution, Ophthalmic) Active. Brimonidine Tartrate (0.15% Solution, Ophthalmic) Active. Ferrous Sulfate (325 (65 Fe)MG Tablet, Oral) Active. Donepezil HCl (10MG  Tablet, Oral) Active. ROPINIRole HCl (0.5MG  Tablet, Oral) Active. Terbinafine HCl (250MG  Tablet, Oral) Active. Alendronate Sodium (70MG  Tablet, Oral) Active. DiazePAM (2MG  Tablet, Oral) Active. Omeprazole (40MG  Capsule DR, Oral) Active. Benzonatate (200MG  Capsule, Oral as needed) Active. Sertraline HCl (50MG  Tablet, Oral) Active. Levothyroxine Sodium ( Tablet, Oral) Active. Nitroglycerin (0.4MG  Tab Sublingual, Sublingual) Active. Aspirin (81MG  Tablet, Oral) Active. Cholecalciferol (1000UNIT Tablet, Oral) Active. Multivitamin (Oral) Active. Medications Reconciled  Vitals  03/27/2016 1:08 PM Weight: 171 lb Height: 62in Body Surface Area: 1.79 m Body Mass Index: 31.28 kg/m  Temp.: 97.68F  Pulse: 56 (Regular)  BP: 151/69 (Sitting, Left Arm, Standard)  General General Appearance-Not in acute distress. Orientation-Oriented X3. Build & Nutrition-Well nourished and Well developed.  Integumentary General Characteristics Surgical Scars - surgical scarring consistent with previous lumbar surgery. Lumbar Spine-Skin examination of the lumbar spine is without deformity, skin lesions, lacerations or abrasions.  Chest and Lung Exam Auscultation Breath sounds - Normal and Clear.  Cardiovascular Auscultation Rhythm - Regular rate and  rhythm.  Abdomen Palpation/Percussion Palpation and Percussion of the abdomen reveal - Soft, Non Tender and No Rebound tenderness.  Peripheral Vascular Lower Extremity Palpation - Posterior tibial pulse - Bilateral - 2+. Dorsalis pedis pulse - Bilateral - 2+.  Neurologic Sensation Lower Extremity - Left - sensation is intact in the lower extremity. Right - sensation is diminished in  the lower extremity. Reflexes Patellar Reflex - Bilateral - 2+. Achilles Reflex - Bilateral - 2+. Clonus - Bilateral - clonus not present. Hoffman's Sign - Bilateral - Hoffman's sign not present. Testing Seated Straight Leg Raise - Left - Seated straight leg raise positive. Right - Seated straight leg raise positive.  Musculoskeletal Spine/Ribs/Pelvis  Lumbosacral Spine: Inspection and Palpation - Tenderness - left lumbar paraspinals tender to palpation and right lumbar paraspinals tender to palpation. Strength and Tone: Strength - Hip Flexion - Left - 3+/5. Right - 5/5. Knee Extension - Left - 4-/5. Right - 5/5. Knee Flexion - Bilateral - 5/5. Ankle Dorsiflexion - Left - 5/5. Right - 4-/5. Ankle Plantarflexion - Bilateral - 5/5. Heel walk - Bilateral - unable to heel walk. Toe Walk - Bilateral - unable to walk on toes. ROM - Flexion - moderately decreased range of motion and painful. Extension - moderately decreased range of motion and painful. Left Lateral Bending - moderately decreased range of motion and painful. Right Lateral Bending - moderately decreased range of motion and painful. Right Rotation - moderately decreased range of motion and painful. Left Rotation - moderately decreased range of motion and painful. Pain - . Lumbosacral Spine - Waddell's Signs - no Waddell's signs present. Lower Extremity Range of Motion - No true hip, knee or ankle pain with range of motion. Gait and Station - [employer] - cane and wheelchair.  RADIOGRAPHS MRI of her back dated 02/10/2016 shows a large L3-4 disc  herniation, posterolateral to the left with cranial extension almost to the level of the L2-3 disk. There is marked spinal stenosis at L3-4, moderate stenosis at L2-3. There is a previous laminotomy site at L4-5 on the left. No significant residual stenosis at this level. She has disc herniation posterolateral to the right at L5-S1 causing compression of the right S1 nerve root and moderate to significant stenosis.  Assessment & Plan Posterior Lumbar Decompression/disectomy: Risks of surgery include infection, bleeding, nerve damage, death, stroke, paralysis, failure to heal, need for further surgery, ongoing or worse pain, need for further surgery, CSF leak, loss of bowel or bladder, and recurrent disc herniation or Stenosis which would necessitate need for further surgery.  Goal Of Surgery: Discussed that goal of surgery is to reduce pain and improve function and quality of life. Patient is aware that despite all appropriate treatment that there pain and function could be the same, worse, or different.  At this point in time, I think the patient's predominant problems are the L3-4 disc herniation to the left and the L5-S1 to the right. This has been going on. Her pain is getting debilitating over the last year. I think the greatest problem affecting her ability to walk is probably the L3-4 level. In order to address this surgically, I would need to do an L2-3, L3-4 decompression left sided to excise the large disc fragment and address the stenosis and then I would do a right sided hemilaminotomy and discectomy to address the right L5-S1 disc herniation. 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 At this point, we need to get clearance to determine the best course of action. I have gone over the risks with the patient and her husband. These include infection, bleeding, nerve damage, death, stroke, paralysis, failure to heal, need for further  surgery, ongoing or worse pain, recurrent disc herniations, need for fusion surgery. All of their questions were addressed.  My hope is to decrease some of the leg pain and improve her leg strength so that her overall quality of life improve. Once we get clearance from her primary care physician as well as her rheumatologist, we will move forward with surgery accordingly.

## 2016-04-05 NOTE — Anesthesia Procedure Notes (Signed)
Procedure Name: Intubation Date/Time: 04/05/2016 7:46 AM Performed by: Lovie Chol Pre-anesthesia Checklist: Patient identified, Emergency Drugs available, Suction available and Patient being monitored Patient Re-evaluated:Patient Re-evaluated prior to inductionOxygen Delivery Method: Circle System Utilized Preoxygenation: Pre-oxygenation with 100% oxygen Intubation Type: IV induction Ventilation: Mask ventilation without difficulty and Oral airway inserted - appropriate to patient size Laryngoscope Size: Miller and 2 Grade View: Grade I Tube type: Oral Tube size: 7.0 mm Number of attempts: 1 Airway Equipment and Method: Stylet and Oral airway Placement Confirmation: ETT inserted through vocal cords under direct vision,  positive ETCO2 and breath sounds checked- equal and bilateral Secured at: 21 cm Tube secured with: Tape Dental Injury: Teeth and Oropharynx as per pre-operative assessment

## 2016-04-05 NOTE — Anesthesia Postprocedure Evaluation (Signed)
Anesthesia Post Note  Patient: Jennifer Austin  Procedure(s) Performed: Procedure(s) (LRB): LEFT L2-3 DECOMPRESSION AND DISECTOMY AND RIGHT L5-S1 DISECTOMY AND DECOMPRESSION (N/A)  Patient location during evaluation: PACU Anesthesia Type: General Level of consciousness: awake and alert Pain management: pain level controlled Vital Signs Assessment: post-procedure vital signs reviewed and stable Respiratory status: spontaneous breathing, nonlabored ventilation, respiratory function stable and patient connected to nasal cannula oxygen Cardiovascular status: blood pressure returned to baseline and stable Postop Assessment: no signs of nausea or vomiting Anesthetic complications: no    Last Vitals:  Vitals:   04/05/16 1438 04/05/16 1500  BP:  137/65  Pulse:  (!) 52  Resp:  18  Temp: 36.9 C 36.9 C    Last Pain:  Vitals:   04/05/16 1500  TempSrc: Oral  PainSc: 5                  Cecile Hearing

## 2016-04-05 NOTE — Transfer of Care (Signed)
Immediate Anesthesia Transfer of Care Note  Patient: Jennifer Austin  Procedure(s) Performed: Procedure(s): LEFT L2-3 DECOMPRESSION AND DISECTOMY AND RIGHT L5-S1 DISECTOMY AND DECOMPRESSION (N/A)  Patient Location: PACU  Anesthesia Type:General  Level of Consciousness: awake, oriented and patient cooperative  Airway & Oxygen Therapy: Patient Spontanous Breathing and Patient connected to face mask oxygen  Post-op Assessment: Report given to RN and Post -op Vital signs reviewed and stable  Post vital signs: Reviewed  Last Vitals:  Vitals:   04/05/16 0649 04/05/16 1322  BP: (!) 146/61 (!) (P) 154/72  Pulse: (!) 45 (!) (P) 106  Resp: 18   Temp: 36.8 C (P) 36.9 C    Last Pain:  Vitals:   04/05/16 0702  TempSrc:   PainSc: 5          Complications: No apparent anesthesia complications

## 2016-04-05 NOTE — Anesthesia Preprocedure Evaluation (Addendum)
Anesthesia Evaluation  Patient identified by MRN, date of birth, ID band Patient awake    Reviewed: Allergy & Precautions, NPO status , Patient's Chart, lab work & pertinent test results  History of Anesthesia Complications (+) PONV, Emergence Delirium and history of anesthetic complications  Airway Mallampati: II  TM Distance: >3 FB Neck ROM: Full    Dental  (+) Edentulous Upper, Edentulous Lower   Pulmonary pneumonia, resolved, former smoker,    breath sounds clear to auscultation       Cardiovascular hypertension, Pt. on medications + CAD and + Peripheral Vascular Disease  + Valvular Problems/Murmurs  Rhythm:Regular Rate:Normal     Neuro/Psych PSYCHIATRIC DISORDERS Anxiety Depression    GI/Hepatic Neg liver ROS, hiatal hernia, GERD  Medicated and Controlled,  Endo/Other  Hypothyroidism Morbid obesity  Renal/GU Renal disease     Musculoskeletal  (+) Arthritis ,   Abdominal   Peds  Hematology  (+) anemia ,   Anesthesia Other Findings   Reproductive/Obstetrics                           Anesthesia Physical Anesthesia Plan  ASA: III  Anesthesia Plan: General   Post-op Pain Management:    Induction: Intravenous  Airway Management Planned: Oral ETT  Additional Equipment:   Intra-op Plan:   Post-operative Plan: Possible Post-op intubation/ventilation  Informed Consent:   Dental advisory given  Plan Discussed with: CRNA and Anesthesiologist  Anesthesia Plan Comments:         Anesthesia Quick Evaluation

## 2016-04-06 ENCOUNTER — Encounter (HOSPITAL_COMMUNITY): Payer: Self-pay | Admitting: Orthopedic Surgery

## 2016-04-06 DIAGNOSIS — M5116 Intervertebral disc disorders with radiculopathy, lumbar region: Secondary | ICD-10-CM | POA: Diagnosis not present

## 2016-04-06 DIAGNOSIS — M545 Low back pain: Secondary | ICD-10-CM | POA: Diagnosis not present

## 2016-04-06 MED ORDER — CHLORHEXIDINE GLUCONATE CLOTH 2 % EX PADS
6.0000 | MEDICATED_PAD | Freq: Every day | CUTANEOUS | Status: DC
  Administered 2016-04-06: 6 via TOPICAL

## 2016-04-06 MED ORDER — METHOCARBAMOL 500 MG PO TABS: 500.0000 mg | ORAL_TABLET | Freq: Three times a day (TID) | ORAL | 0 refills | Status: AC | PRN

## 2016-04-06 MED ORDER — BRINZOLAMIDE 1 % OP SUSP
1.0000 [drp] | Freq: Three times a day (TID) | OPHTHALMIC | Status: DC
  Administered 2016-04-06: 1 [drp] via OPHTHALMIC
  Filled 2016-04-06: qty 10

## 2016-04-06 MED ORDER — MUPIROCIN 2 % EX OINT
1.0000 "application " | TOPICAL_OINTMENT | Freq: Two times a day (BID) | CUTANEOUS | Status: DC
  Administered 2016-04-06: 1 via NASAL
  Filled 2016-04-06: qty 22

## 2016-04-06 MED ORDER — BRIMONIDINE TARTRATE 0.2 % OP SOLN
1.0000 [drp] | Freq: Three times a day (TID) | OPHTHALMIC | Status: DC
  Administered 2016-04-06: 1 [drp] via OPHTHALMIC
  Filled 2016-04-06: qty 5

## 2016-04-06 MED ORDER — ONDANSETRON HCL 4 MG PO TABS: 4.0000 mg | ORAL_TABLET | Freq: Three times a day (TID) | ORAL | 0 refills | Status: AC | PRN

## 2016-04-06 MED ORDER — HYDROCODONE-ACETAMINOPHEN 10-325 MG PO TABS: 1.0000 | ORAL_TABLET | Freq: Four times a day (QID) | ORAL | 0 refills | Status: AC | PRN

## 2016-04-06 NOTE — Evaluation (Signed)
Occupational Therapy Evaluation Patient Details Name: Jennifer Austin MRN: [id number] DOB: 01-Mar-1941 Today's Date: 04/06/2016    History of Present Illness Pt admitted for LEFT L2-3 DECOMPRESSION AND DISECTOMY AND RIGHT L5-S1 DISECTOMY AND DECOMPRESSION via Dr Shon Baton.   Clinical Impression   Pt currently at supervision to min guard assist level for selfcare tasks, toileting, and shower transfers.  Will have 24 hour assist post discharge from family.  No further OT needs at this time or follow-up recommended.     Follow Up Recommendations  No OT follow up;Supervision/Assistance - 24 hour    Equipment Recommendations  None recommended by OT       Precautions / Restrictions Precautions Precautions: Back Precaution Booklet Issued: Yes (comment) Precaution Comments: Pt able to state 2/3 initially Required Braces or Orthoses: Spinal Brace Spinal Brace: Lumbar corset Restrictions Weight Bearing Restrictions: No      Mobility Bed Mobility               General bed mobility comments: Pt up in chair  Transfers Overall transfer level: Needs assistance Equipment used: Rolling walker (2 wheeled) Transfers: Sit to/from Stand Sit to Stand: Min guard              Balance Overall balance assessment: Needs assistance   Sitting balance-Leahy Scale: Good       Standing balance-Leahy Scale: Poor Standing balance comment: Needs UE support from walker for mobility and selfcare tasks.                             ADL Overall ADL's : Needs assistance/impaired Eating/Feeding: Independent   Grooming: Wash/dry hands;Wash/dry face;Supervision/safety;Standing   Upper Body Bathing: Set up;Sitting   Lower Body Bathing: Supervison/ safety;With adaptive equipment;Adhering to back precautions;Sit to/from stand   Upper Body Dressing : Supervision/safety;Sitting   Lower Body Dressing: Supervision/safety;With adaptive equipment;Adhering to back precautions;Sit to/from  stand   Toilet Transfer: Supervision/safety;Ambulation;BSC;RW   Toileting- Clothing Manipulation and Hygiene: Moderate assistance;Sit to/from stand;Cueing for back precautions   Tub/ Shower Transfer: Walk-in shower;Supervision/safety;Ambulation;Anterior/posterior   Functional mobility during ADLs: Supervision/safety;Rolling walker General ADL Comments: Pt is familiar with AE secondary to other hip surgeries and she was able to demonstrate appropriate use.  She reports having assistance from spouse and daughters at discharge and stated "They won't let me do anything.".  Educated on back precautions and provided handout.       Vision Vision Assessment?: No apparent visual deficits   Perception Perception Perception Tested?: No   Praxis Praxis Praxis tested?: Not tested    Pertinent Vitals/Pain Pain Assessment: 0-10 Pain Score: 3  Pain Location: lower back Pain Descriptors / Indicators: Discomfort Pain Intervention(s): Repositioned;Limited activity within patient's tolerance;Monitored during session     Hand Dominance Left   Extremity/Trunk Assessment Upper Extremity Assessment Upper Extremity Assessment: Overall WFL for tasks assessed (AROM WFLS adhering to back precautions.  )   Lower Extremity Assessment Lower Extremity Assessment: Defer to PT evaluation   Cervical / Trunk Assessment Cervical / Trunk Assessment: Kyphotic (Pt with soft lumbar corsett)   Communication Communication Communication: No difficulties   Cognition Arousal/Alertness: Awake/alert Behavior During Therapy: WFL for tasks assessed/performed Overall Cognitive Status: No family/caregiver present to determine baseline cognitive functioning       Memory: Decreased short-term memory (Pt not oriented to place or time.  But did state "October 11".)  Home Living Family/patient expects to be discharged to:: Private residence Living Arrangements: Spouse/significant  other Available Help at Discharge: Available 24 hours/day;Family Type of Home: House Home Access: Stairs to enter [employer] of Steps: 2 Entrance Stairs-Rails: Right Home Layout: One level     Bathroom Shower/Tub: Producer, television/film/video: Handicapped height Bathroom Accessibility: Yes   Home Equipment: Environmental consultant - 2 wheels;Bedside commode;Shower seat;Wheelchair - manual      Lives With: Spouse    Prior Functioning/Environment Level of Independence: Needs assistance  Gait / Transfers Assistance Needed: Mod i in the house with cane but needed min assist for mobility outside the house per her report./ ADL's / Homemaking Assistance Needed: mod i   Comments: Used cane outside                               End of Session Equipment Utilized During Treatment: Rolling walker;Back brace Nurse Communication: Mobility status  Activity Tolerance: Patient tolerated treatment well Patient left: in chair;with call bell/phone within reach;with chair alarm set   Time: 1751-0258 OT Time Calculation (min): 35 min Charges:  OT General Charges $OT Visit: 1 Procedure OT Evaluation $OT Eval Moderate Complexity: 1 Procedure OT Treatments $Self Care/Home Management : 8-22 mins  [name] OTR/L 04/06/2016, 9:05 AM

## 2016-04-06 NOTE — Progress Notes (Signed)
Patient has her TLSO brace in place.

## 2016-04-06 NOTE — Progress Notes (Signed)
Orthopedic Tech Progress Note Patient Details:  Jennifer Austin [birth year] [id number]  Patient ID: [name], female   DOB: Feb 11, 1941, 75 y.o.   MRN: [id number]   Saul Fordyce 04/06/2016, 9:00 AMCalled [employer] for LSO.

## 2016-04-06 NOTE — Evaluation (Signed)
Physical Therapy Evaluation Patient Details Name: Jennifer Austin MRN: [id number] DOB: [birth year] Today's Date: 04/06/2016   History of Present Illness  Pt admitted for LEFT L2-3 DECOMPRESSION AND DISECTOMY AND RIGHT L5-S1 DISECTOMY AND DECOMPRESSION via Dr Shon Baton.  Clinical Impression  Pt is at or close to baseline functioning and should be safe at home her 2 daughters available.  All education has been address/completed and a written copy given to her by OT. There are no further acute PT needs.  Will sign off at this time.     Follow Up Recommendations No PT follow up    Equipment Recommendations  None recommended by PT    Recommendations for Other Services       Precautions / Restrictions Precautions Precautions: Back Precaution Booklet Issued: Yes (comment) Precaution Comments: Pt able to state 2/3 initially Required Braces or Orthoses: Spinal Brace Spinal Brace: Lumbar corset Restrictions Weight Bearing Restrictions: No      Mobility  Bed Mobility Overal bed mobility: Needs Assistance Bed Mobility: Sit to Sidelying;Rolling Rolling: Min guard       Sit to sidelying: Min guard General bed mobility comments: very guarded, but safe technique  Transfers Overall transfer level: Needs assistance Equipment used: Rolling walker (2 wheeled) Transfers: Sit to/from Stand Sit to Stand: Min guard         General transfer comment: cues for hand placement  Ambulation/Gait Ambulation/Gait assistance: Supervision Ambulation Distance (Feet): 200 Feet Assistive device: Rolling walker (2 wheeled) Gait Pattern/deviations: Step-through pattern Gait velocity: slower Gait velocity interpretation: at or above normal speed for age/gender General Gait Details: steady with flexed posture  Stairs Stairs: Yes Stairs assistance: Min assist Stair Management: Two rails;Step to pattern;Forwards Number of Stairs: 2 General stair comments: effortful, but safe with assist  Wheelchair  Mobility    Modified Rankin (Stroke Patients Only)       Balance Overall balance assessment: Needs assistance   Sitting balance-Leahy Scale: Good       Standing balance-Leahy Scale: Poor Standing balance comment: reliant on the RW                             Pertinent Vitals/Pain Pain Assessment: Faces Pain Score: 3  Faces Pain Scale: Hurts little more Pain Location: low back Pain Descriptors / Indicators: Discomfort Pain Intervention(s): Monitored during session;Repositioned    Home Living Family/patient expects to be discharged to:: Private residence Living Arrangements: Spouse/significant other Available Help at Discharge: Available 24 hours/day;Family Type of Home: House Home Access: Stairs to enter Entrance Stairs-Rails: Right Entrance Stairs-Number of Steps: 2 Home Layout: One level Home Equipment: Walker - 2 wheels;Bedside commode;Shower seat;Wheelchair - manual      Prior Function Level of Independence: Needs assistance   Gait / Transfers Assistance Needed: Mod i in the house with cane but needed min assist for mobility outside the house per her report./  ADL's / Homemaking Assistance Needed: mod i  Comments: Used cane outside     Hand Dominance   Dominant Hand: Left    Extremity/Trunk Assessment   Upper Extremity Assessment: Defer to OT evaluation           Lower Extremity Assessment: Overall WFL for tasks assessed      Cervical / Trunk Assessment: Kyphotic (Pt with soft lumbar corsett)  Communication   Communication: No difficulties  Cognition Arousal/Alertness: Awake/alert Behavior During Therapy: WFL for tasks assessed/performed Overall Cognitive Status: No family/caregiver present to determine baseline cognitive functioning  Memory: Decreased short-term memory (Pt not oriented to place or time.  But did state "October 11".)              General Comments General comments (skin integrity, edema, etc.): pt  instructed in back care/prec, log roll/transitions to/from sidelying, lifting restrictions, bracing issues and progression of activity.    Exercises     Assessment/Plan    PT Assessment Patent does not need any further PT services  PT Problem List Decreased activity tolerance;Decreased mobility;Decreased knowledge of use of DME;Decreased knowledge of precautions;Pain          PT Treatment Interventions      PT Goals (Current goals can be found in the Care Plan section)  Acute Rehab PT Goals PT Goal Formulation: All assessment and education complete, DC therapy    Frequency     Barriers to discharge        Co-evaluation               End of Session   Activity Tolerance: Patient tolerated treatment well Patient left: in bed;with call bell/phone within reach Nurse Communication: Mobility status         Time: 3220-2542 PT Time Calculation (min) (ACUTE ONLY): 26 min   Charges:   PT Evaluation $PT Eval Low Complexity: 1 Procedure PT Treatments $Gait Training: 8-22 mins   PT G Codes:        Shawnmichael Parenteau, Eliseo Gum 04/06/2016, 10:41 AM 04/06/2016  [place] Bing, PT [phone]  (pager)

## 2016-04-06 NOTE — Progress Notes (Signed)
Pt discharge education and instructions completed with pt and family at bedside; all voices understanding and denies any questions. Pt IV removed; pt back incision remains clean, dry and intact with no s/s infection noted. Pt discharge home with family to transport her home. Pt handed her prescriptions for Norco, zofran and robaxin. Pt transported off unit via wheelchair with belongings and family to the side. Dionne Bucy RN

## 2016-04-06 NOTE — Discharge Instructions (Signed)
Restart aspirin on Sunday 04/08/16

## 2016-04-06 NOTE — Progress Notes (Signed)
    Subjective: Procedure(s) (LRB): LEFT L2-3 DECOMPRESSION AND DISECTOMY AND RIGHT L5-S1 DISECTOMY AND DECOMPRESSION (N/A) 1 Day Post-Op  Patient reports pain as 2 on 0-10 scale.  Reports decreased leg pain reports incisional back pain   Positive void Negative bowel movement Positive flatus Negative chest pain or shortness of breath  Objective: Vital signs in last 24 hours: Temp:  [98.1 F (36.7 C)-98.5 F (36.9 C)] 98.2 F (36.8 C) (10/13 0509) Pulse Rate:  [52-106] 57 (10/13 0509) Resp:  [10-32] 20 (10/13 0509) BP: (101-154)/(43-72) 109/43 (10/13 0509) SpO2:  [79 %-100 %] 100 % (10/13 0509)  Intake/Output from previous day: 10/12 0701 - 10/13 0700 In: 3450 [I.V.:3400; IV Piggyback:50] Out: 855 [Urine:350; Drains:255; Blood:250]  Labs: No results for input(s): WBC, RBC, HCT, PLT in the last 72 hours. No results for input(s): NA, K, CL, CO2, BUN, CREATININE, GLUCOSE, CALCIUM in the last 72 hours. No results for input(s): LABPT, INR in the last 72 hours.  Physical Exam: Neurologically intact ABD soft Intact pulses distally Incision: dressing C/D/I Compartment soft Minimal drainage Ambulating   Assessment/Plan: Patient stable  xrays n/a Continue mobilization with physical therapy Continue care  Advance diet Up with therapy  Ambulating with minimal neuropathic leg pain Will change corset to LSO for added support given need for in situ fusion Plan on d/c today after she works with PT   Venita Lick, MD [facility] [phone]

## 2016-04-06 NOTE — Op Note (Signed)
Jennifer Austin, Jennifer Austin                  ACCOUNT NO.:  [id number]  MEDICAL RECORD NO.:  [id number]  LOCATION:  5C04C                        FACILITY:  [facility]  PHYSICIAN:  Britta Louth D. Shon Baton, M.D. DATE OF BIRTH:  Jul 20, 1940  DATE OF PROCEDURE:  04/05/2016 DATE OF DISCHARGE:                              OPERATIVE REPORT   PREOPERATIVE DIAGNOSES:  Large left L3-4 disk herniation with migration superiorly to the L2-3 disk space and recurrent disk herniation at L5-S1 posterolateral into the right with the right radicular leg pain.  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  OPERATIVE PROCEDURE: 1. Gill decompression left side L2-3 with complete excision of the L3     inferior facet as well as resection of the pars.  Complete     decompression diskectomy at that level as well. 2. In situ arthrodesis L3-4 with autograft bone plus DBX mix as well     as revision lumbar decompression diskectomy L5-S1 right side with     excision of disk material.  FIRST ASSISTANT:  Anette Riedel, my PA.  COMPLICATIONS:  None.  CONDITION:  Stable.  HISTORY:  This is a very pleasant, elderly woman, who has been having severe back, buttock, and bilateral neuropathic leg pain.  Attempts at conservative management had failed.  Imaging studies demonstrated significant pathology at the 2 indicated levels and so we elected to proceed with surgery.  All appropriate risks, benefits, alternatives were discussed with the patient and consent was obtained.  INTRAOPERATIVE FINDINGS:  Because of the extensive scarring and direct adhesion of the disk herniation at the L3-4 level to the thecal sac, a very wide decompression was necessary.  I was concerned that I had created essentially an unstable in spine which is why I elected to proceed with the in-situ  fusion at that level.  OPERATIVE NOTE:  The patient was brought to the operating room and placed supine on the operating room table.  After successful induction of general anesthesia and endotracheal intubation, TEDs, SCDs, and a Foley were inserted.  She was turned prone onto the Wilson frame and all bony prominences were well padded.  The back was then prepped and draped in a standard fashion.  Time-out was taken confirming patient, procedure, and all other pertinent important data.  Once time-out was completed, needles were placed in the back.  X-ray was taken to localize incision site.  The incision site was marked and infiltrated with 0.25% Marcaine.  A midline incision was made.  Sharp dissection was carried out down to the deep fascia.  I incised the deep fascia and using Bovie and Cobb, I stripped the paraspinal muscles to expose the L2, L3, and the superior portion of the L4 spinous process and lamina.  There was significant scarring tissue at the 3-4, and 4-5 levels consistent with her 2 previous operations.  However, up at the 2-3 level, this was essentially original territory.  This allowed me to establish my bony landmarks.  I stripped the paraspinal and exposed the 2-3 and 3-4 facet  complex was well as the 4-5 complex.  Once I had this exposed, I then placed a Penfield 4 underneath the L3 lamina and took an intraoperative x-ray.  I confirmed that I was at the appropriate level.  Because of the central and lateral recess stenosis at this large disk fragments causing, I elected to perform a central decompression and allowing me to work over into the lateral recess to adequately decompress and excise the fragment.  Therefore, I removed the inferior portion of the L3 spinous process and performed a hemilaminectomy of L3 on the left side. A small partial laminotomy on the right side was created.  The facet complex and pars on the right side were left intact.  I then continued to  dissect using a Penfield 4 through the ligamentum flavum and then I resected the ligamentum flavum starting at the midline and moving into the lateral recess on the left side.  I identified the traversing L4 nerve root as well as L3-4 disk.  There was significant adhesions and significant displacement of the thecal sac.  This made it quite difficult to develop a plane moving in a cephalad manner consistent with what was seen on the MRI.  Therefore, I resected the inferior L3 facet complex in order to adequately visualize the thecal sac.  Once this was done, in order to adequately decompress the L3 nerve root in the foramen, I also resected the pars of L3.  This allowed me to visualize the exiting L3 and traversing L4 nerve roots.  I could now visualize from the superior border of the L4 pedicle down to the inferior border of the L3 pedicle.  Essentially from the to 2-3 disk to the 3-4 disk and this was the exact area based on the preoperative MRI where the disk fragment was.  I then began sweeping underneath the annulus removing large fragments of disk material.  I entered into the 3-4 disk space and using micropituitary rongeurs, I resected fragments of disk material. Then, using my Epstein curette, I was able to debulk the large disk fragments.  There was significant compression noted to the thecal sac that was very visible.  As I continued to gently manipulate and move out the fragments, I noted that the thecal sac was also quite adherent to this.  This was all on the ventral surface of the thecal sac. Ultimately with a wide posterodorsal decompression, there was no further dorsal compression of the thecal sac or the nerve roots.  I elected not to get more aggressive in my diskectomy for fear of causing a ventral CSF leak.  At this point, with the neuro-elements adequately decompressed in what I felt was the majority of the disk fragments removed.  I then went to the posterolateral  gutter.  Because of the extensive decompression on one side, I elected to do an in situ fusion. I identified the 3-4 facet complex in the L4 and the L3 transverse processes.  Using a high-speed bur, I decorticated these on the right- hand side and packed the posterolateral gutter with the bone graft that I had harvested from the decompression along with some supplemental DBX mix.  I then turned my attention down to the 5-1 level.  I took another x-ray confirming the 5-1 level and I began working.  Because this was all the right posterolateral recess compression, I identified the remaining portion of the L5 lamina that was intact and then using a curette, began sweeping back the dissecting and created a  plane between the scar tissue and the bone itself.  I enhanced the laminotomy defect a little bit but this also allowed me to move out and resect the medial portion of the inferior L5 facet.  This allowed me also to gain access into the posterolateral gutter by resecting the medial aspect of the superior S1 facet.  Once I was able to the this, I then dissected down until I could see the posterolateral aspect of the disk itself.  I identified the epidural veins and then identified S1 traversing nerve root.  Coming in from the lateral, allowed me to identify my planes and limit my need for scar excision.  I then swept underneath the nerve root and delivered 2 fragments of disk material consistent with what was seen on the MRI.  Once these were removed, the S1 nerve root was no longer under tension.  They could freely move.  I was able to pass my [facility] elevator superiorly and inferiorly and out the foramen.  At this point, I was quite pleased with this decompression as well.  At this point, I copiously irrigated the wound with normal saline and then obtained and maintained hemostasis using bipolar electrocautery and FloSeal.  At this point, I then placed a deep drain and it was brought out  through a separate stab hole.  I then closed the deep fascia with interrupted #1 Vicryl sutures in a layer running 0 and interrupted 2-0, and then a 3-0 Monocryl.  Steri- Strips and dry dressing were applied.  The patient will be extubated. Transferred to the PACU without incident.  At the end of the case, all needle and sponge counts were correct.     Jocsan Mcginley D. Shon Baton, M.D.     DDB/MEDQ  D:  04/05/2016  T:  04/06/2016  Job:  [id number]

## 2016-04-06 NOTE — Care Management Note (Signed)
Case Management Note  Patient Details  Name: Jennifer Austin MRN: [id number] Date of Birth: [birth year]  Subjective/Objective:   Pt underwent lumbar surgery. She is from home with her husband.                  Action/Plan: No f/u per PT/OT and no DME needs. OT recommending 24 hour supervision. CM met with the patient and she states her family is able to provide 24 hour supervision. No further needs per CM.   Expected Discharge Date:   (Pending)               Expected Discharge Plan:  Home/Self Care  In-House Referral:     Discharge planning Services  CM Consult  Post Acute Care Choice:    Choice offered to:     DME Arranged:    DME Agency:     HH Arranged:    [place] Agency:     Status of Service:  Completed, signed off  If discussed at [employer] of Stay Meetings, dates discussed:    Additional Comments:  Pollie Friar, RN 04/06/2016, 12:37 PM

## 2016-05-02 NOTE — Discharge Summary (Signed)
Patient ID: Jennifer Austin MRN: [id number] DOB/AGE: [birth year] 75 y.o.  Admit date: 04/05/2016 Discharge date: 05/02/2016  Admission Diagnoses:  Active Problems:   Back pain   Discharge Diagnoses:  Active Problems:   Back pain  status post Procedure(s): LEFT L2-3 DECOMPRESSION AND DISECTOMY AND RIGHT L5-S1 DISECTOMY AND DECOMPRESSION  Past Medical History:  Diagnosis Date  . Anemia    Iron deficiency  . Arthralgia   . Arthritis    osteoarthritis  . Back ache   . CAD (coronary artery disease)   . Chest pain, unspecified   . Complication of anesthesia    "hard to wake up". Surgery in [place] 2014 was great.  . Cough   . Depressive disorder, not elsewhere classified   . Family history of anesthesia complication    Daughter  . Fatigue   . Foot pain   . GERD (gastroesophageal reflux disease)   . H/O hiatal hernia   . Heart murmur   . HOH (hard of hearing)   . Hyperlipidemia    Dyslipidemia  . Hypertension   . Hypothyroidism   . Insomnia   . Memory loss   . Myalgia and myositis, unspecified   . Obesity   . Pneumonia     last time  . Prediabetes   . Reflux esophagitis   . Renal artery stenosis (HCC)   . Rheumatoid arthritis (HCC)   . Tenosynovitis of foot and ankle   . Thrombocytopenia, unspecified   . Thyroid disease    Hypothyroidsm  . Unspecified vitamin D deficiency   . Urge incontinence     Surgeries: Procedure(s): LEFT L2-3 DECOMPRESSION AND DISECTOMY AND RIGHT L5-S1 DISECTOMY AND DECOMPRESSION on 04/05/2016   Consultants:   Discharged Condition: Improved  Hospital Course: Jennifer Austin is an 75 y.o. female who was admitted 04/05/2016 for operative treatment of <principal problem not specified>. Patient failed conservative treatments (please see the history and physical for the specifics) and had severe unremitting pain that affects sleep, daily activities and work/hobbies. After pre-op clearance, the patient was taken to the operating room on 04/05/2016  and underwent  Procedure(s): LEFT L2-3 DECOMPRESSION AND DISECTOMY AND RIGHT L5-S1 DISECTOMY AND DECOMPRESSION.  Post op day one pt reports decreased leg pain.  She reports incisional pain that is controlled on oral meds.  Pt is urinating w/o difficulty.  Denies nausea.  Pt is ambulating in hall. Pt given LSO.  Pt cleared by PT for DC.  Patient was given perioperative antibiotics:  Anti-infectives    Start     Dose/Rate Route Frequency Ordered Stop   04/05/16 1600  ceFAZolin (ANCEF) IVPB 1 g/50 mL premix     1 g 100 mL/hr over 30 Minutes Intravenous Every 8 hours 04/05/16 1503 04/06/16 0009   04/05/16 0900  ceFAZolin (ANCEF) IVPB 2g/100 mL premix  Status:  Discontinued     2 g 200 mL/hr over 30 Minutes Intravenous To ShortStay Surgical 04/05/16 0652 04/05/16 0652   04/05/16 0700  ceFAZolin (ANCEF) IVPB 2g/100 mL premix     2 g 200 mL/hr over 30 Minutes Intravenous To ShortStay Surgical 04/05/16 0652 04/05/16 0749   04/05/16 0538  ceFAZolin (ANCEF) 2-4 GM/100ML-% IVPB    Comments:  Edwina Barth   : cabinet override      04/05/16 0538 04/05/16 0749       Patient was given sequential compression devices and early ambulation to prevent DVT.   Patient benefited maximally from hospital stay and there were no complications. At  the time of discharge, the patient was urinating/moving their bowels without difficulty, tolerating a regular diet, pain is controlled with oral pain medications and they have been cleared by PT/OT.   Recent vital signs: No data found.    Recent laboratory studies: No results for input(s): WBC, HGB, HCT, PLT, NA, K, CL, CO2, BUN, CREATININE, GLUCOSE, INR, CALCIUM in the last 72 hours.  Invalid input(s): PT, 2   Discharge Medications:     Medication List    STOP taking these medications   acetaminophen 500 MG chewable tablet Commonly known as:  TYLENOL   cyclobenzaprine 10 MG tablet Commonly known as:  FLEXERIL   oxyCODONE-acetaminophen 5-325 MG  tablet Commonly known as:  PERCOCET/ROXICET   zolpidem 10 MG tablet Commonly known as:  AMBIEN     TAKE these medications   aspirin EC 81 MG tablet Take 81 mg by mouth every morning.   benzonatate 200 MG capsule Commonly known as:  TESSALON Take 200 mg by mouth 3 (three) times daily as needed for cough.   BETIMOL 0.5 % ophthalmic solution Generic drug:  timolol Place 1 drop into the right eye 2 (two) times daily.   CALCIUM 500/D 500-200 MG-UNIT tablet Generic drug:  calcium-vitamin D Take 1 tablet by mouth every other day.   donepezil 10 MG tablet Commonly known as:  ARICEPT Take 10 mg by mouth at bedtime.   erythromycin ophthalmic ointment Place into both eyes 2 (two) times daily.   folic acid 1 MG tablet Commonly known as:  FOLVITE Take 1 mg by mouth daily.   HYDROcodone-acetaminophen 10-325 MG tablet Commonly known as:  NORCO Take 1 tablet by mouth every 6 (six) hours as needed.   leflunomide 10 MG tablet Commonly known as:  ARAVA Take 10 mg by mouth daily.   levothyroxine 137 MCG tablet Commonly known as:  SYNTHROID, LEVOTHROID Take 137 mcg by mouth daily.   losartan 50 MG tablet Commonly known as:  COZAAR Take 50 mg by mouth daily.   methocarbamol 500 MG tablet Commonly known as:  ROBAXIN Take 1 tablet (500 mg total) by mouth 3 (three) times daily as needed for muscle spasms.   methotrexate 2.5 MG tablet Take 30 mg by mouth every Saturday.   multivitamin tablet Take 1 tablet by mouth daily.   nitroGLYCERIN 0.4 MG SL tablet Commonly known as:  NITROSTAT Place 0.4 mg under the tongue every 5 (five) minutes as needed for chest pain.   ondansetron 4 MG tablet Commonly known as:  ZOFRAN Take 1 tablet (4 mg total) by mouth every 8 (eight) hours as needed for nausea or vomiting.   oxybutynin 10 MG 24 hr tablet Commonly known as:  DITROPAN-XL Take 10 mg by mouth daily.   pravastatin 80 MG tablet Commonly known as:  PRAVACHOL Take 80 mg by mouth  daily.   rOPINIRole 0.5 MG tablet Commonly known as:  REQUIP Take 0.5 mg by mouth at bedtime.   sertraline 50 MG tablet Commonly known as:  ZOLOFT Take 50 mg by mouth daily.   SIMBRINZA 1-0.2 % Susp Generic drug:  Brinzolamide-Brimonidine Place 1 drop into the right eye 3 (three) times daily.   VITAMIN D-1000 MAX ST 1000 units tablet Generic drug:  Cholecalciferol Take 5,000 Units by mouth daily.       Diagnostic Studies: Dg Lumbar Spine 2-3 Views  Result Date: 04/05/2016 CLINICAL DATA:  Lumbar laminectomy EXAM: LUMBAR SPINE - 2-3 VIEW COMPARISON:  Lumbar MRI Jennifer 9, 2016 FINDINGS: Cross-table  lateral lumbar image labeled #1 submitted. Metallic probe tips are posterior to the mid L1 vertebral body and superior L3 vertebral body levels respectively. There is disc space narrowing at all levels in the lower thoracic and lumbar region. Cross-table lateral lumbar image labeled #2 submitted. Metallic probe tip is posterior to the L2-3 interspace level. There is disc space narrowing at all levels with vacuum phenomenon at T12-L1, L1-2, L2-3, and L3-4. IMPRESSION: On the second submitted lumbar image, metallic probe tip is posterior to the L2-3 interspace level. Multilevel arthropathy noted. Electronically Signed   By: Bretta Bang III M.D.   On: 04/05/2016 11:28   Dg Lumbar Spine 1 View  Result Date: 04/05/2016 CLINICAL DATA:  Intraoperative localization film for patient undergoing lumbar decompression. EXAM: LUMBAR SPINE - 1 VIEW COMPARISON:  Intraoperative film this same day at 8:45 a.m. FINDINGS: Two probes are in place. The more superior projects at the superior aspect of the L3 pedicles. Inferior probe is at the level of L5-S1. There is a clamp on the L5 and possibly S1 spinous processes. IMPRESSION: As above. Electronically Signed   By: Drusilla Kanner M.D.   On: 04/05/2016 11:05   Dg Lumbar Spine 1 View  Result Date: 04/05/2016 CLINICAL DATA:  L3-L4 laminectomy and micro  discectomy. EXAM: LUMBAR SPINE - 1 VIEW COMPARISON:  MRI 09/01/2014 FINDINGS: Surgical marker is posterior to L3-L4. Alignment of lumbar spine is grossly normal. Multilevel degenerative endplate changes in the lower thoracic spine and upper lumbar spine. Disc space narrowing at L4-L5. IMPRESSION: Surgical marking at L3-L4. Electronically Signed   By: Richarda Overlie M.D.   On: 04/05/2016 09:00      Follow-up Information    Alvy Beal, MD. Go in 2 week(s).   Specialty:  Orthopedic Surgery Contact information: [address] [place] [address] [phone]           Discharge Plan:  discharge to home Post op meds provided Pt will return to clinic in 2 weeks.   Disposition:     Signed: MayoBaxter Kail for Dr. Venita Lick [facility] [phone] 05/02/2016, 11:01 AM

## 2016-10-10 DIAGNOSIS — E039 Hypothyroidism, unspecified: Secondary | ICD-10-CM

## 2016-10-10 DIAGNOSIS — F0390 Unspecified dementia without behavioral disturbance: Secondary | ICD-10-CM

## 2016-10-10 DIAGNOSIS — E785 Hyperlipidemia, unspecified: Secondary | ICD-10-CM | POA: Diagnosis not present

## 2016-10-10 DIAGNOSIS — H5440 Blindness, one eye, unspecified eye: Secondary | ICD-10-CM

## 2016-10-10 DIAGNOSIS — K219 Gastro-esophageal reflux disease without esophagitis: Secondary | ICD-10-CM | POA: Diagnosis not present

## 2016-10-10 DIAGNOSIS — I10 Essential (primary) hypertension: Secondary | ICD-10-CM | POA: Diagnosis not present

## 2016-10-10 DIAGNOSIS — H538 Other visual disturbances: Secondary | ICD-10-CM

## 2016-10-10 DIAGNOSIS — K37 Unspecified appendicitis: Secondary | ICD-10-CM

## 2016-10-11 DIAGNOSIS — K37 Unspecified appendicitis: Secondary | ICD-10-CM | POA: Diagnosis not present

## 2016-10-11 DIAGNOSIS — H538 Other visual disturbances: Secondary | ICD-10-CM | POA: Diagnosis not present

## 2016-10-11 DIAGNOSIS — F0390 Unspecified dementia without behavioral disturbance: Secondary | ICD-10-CM | POA: Diagnosis not present

## 2016-10-11 DIAGNOSIS — H5440 Blindness, one eye, unspecified eye: Secondary | ICD-10-CM | POA: Diagnosis not present

## 2016-10-12 NOTE — Progress Notes (Signed)
Late Entry Addendum for the Initial Eval    04/06/16 1026  PT Time Calculation  PT Start Time (ACUTE ONLY) 0954  PT Stop Time (ACUTE ONLY) 1020  PT Time Calculation (min) (ACUTE ONLY) 26 min  PT G-Codes **NOT FOR INPATIENT CLASS**  Functional Assessment Tool Used Clinical judgement  Functional Limitation Mobility: Walking and moving around  Mobility: Walking and Moving Around Current Status (S0630) CI  Mobility: Walking and Moving Around Goal Status (Z6010) CI  Mobility: Walking and Moving Around Discharge Status (X3235) CI  PT General Charges  $$ ACUTE PT VISIT 1 Procedure  PT Evaluation  $PT Eval Low Complexity 1 Procedure  PT Treatments  $Gait Training 8-22 mins   10/12/2016  Wickes Bing, PT [phone]  (pager)

## 2016-10-15 NOTE — Progress Notes (Signed)
OT Note - Addendum    2016/05/06 1030  OT Visit Information  Last OT Received On 05-06-16  OT G-codes **NOT FOR INPATIENT CLASS**  Functional Assessment Tool Used Clinical judgement  Functional Limitation Self care  Self Care Current Status [phone]) CI  Self Care Goal Status (L5449) CI  Self Care Discharge Status [phone]) CI  [facility] OT/L  [phone] 10/15/2016 for 2016-05-06

## 2018-12-17 ENCOUNTER — Other Ambulatory Visit: Payer: Self-pay | Admitting: Neurosurgery

## 2018-12-17 DIAGNOSIS — M48062 Spinal stenosis, lumbar region with neurogenic claudication: Secondary | ICD-10-CM

## 2018-12-30 ENCOUNTER — Other Ambulatory Visit: Payer: Self-pay

## 2018-12-30 ENCOUNTER — Ambulatory Visit
Admission: RE | Admit: 2018-12-30 | Discharge: 2018-12-30 | Disposition: A | Discharge status: Home / Self Care | Payer: Medicare HMO | Source: Ambulatory Visit | Attending: Neurosurgery | Admitting: Neurosurgery

## 2018-12-30 DIAGNOSIS — M48062 Spinal stenosis, lumbar region with neurogenic claudication: Secondary | ICD-10-CM

## 2018-12-30 MED ORDER — IOPAMIDOL (ISOVUE-M 200) INJECTION 41%
1.0000 mL | Freq: Once | INTRAMUSCULAR | Status: AC
  Administered 2018-12-30: 1 mL via EPIDURAL

## 2018-12-30 MED ORDER — METHYLPREDNISOLONE ACETATE 40 MG/ML INJ SUSP (RADIOLOG
120.0000 mg | Freq: Once | INTRAMUSCULAR | Status: AC
  Administered 2018-12-30: 11:00:00 120 mg via EPIDURAL

## 2018-12-30 NOTE — Discharge Instructions (Signed)

## 2019-06-03 ENCOUNTER — Emergency Department (HOSPITAL_COMMUNITY)
Admission: EM | Admit: 2019-06-03 | Discharge: 2019-06-04 | Disposition: A | Discharge status: Home / Self Care | Payer: Medicare HMO | Attending: Emergency Medicine | Admitting: Emergency Medicine

## 2019-06-03 ENCOUNTER — Emergency Department (HOSPITAL_COMMUNITY): Payer: Medicare HMO

## 2019-06-03 ENCOUNTER — Other Ambulatory Visit: Payer: Self-pay

## 2019-06-03 ENCOUNTER — Encounter (HOSPITAL_COMMUNITY): Payer: Self-pay | Admitting: *Deleted

## 2019-06-03 DIAGNOSIS — M549 Dorsalgia, unspecified: Secondary | ICD-10-CM | POA: Diagnosis not present

## 2019-06-03 DIAGNOSIS — Z79899 Other long term (current) drug therapy: Secondary | ICD-10-CM | POA: Diagnosis not present

## 2019-06-03 DIAGNOSIS — M25551 Pain in right hip: Secondary | ICD-10-CM | POA: Insufficient documentation

## 2019-06-03 DIAGNOSIS — Z7982 Long term (current) use of aspirin: Secondary | ICD-10-CM | POA: Insufficient documentation

## 2019-06-03 DIAGNOSIS — I2510 Atherosclerotic heart disease of native coronary artery without angina pectoris: Secondary | ICD-10-CM | POA: Diagnosis not present

## 2019-06-03 DIAGNOSIS — R10811 Right upper quadrant abdominal tenderness: Secondary | ICD-10-CM | POA: Diagnosis not present

## 2019-06-03 DIAGNOSIS — Z96643 Presence of artificial hip joint, bilateral: Secondary | ICD-10-CM | POA: Insufficient documentation

## 2019-06-03 DIAGNOSIS — R079 Chest pain, unspecified: Secondary | ICD-10-CM | POA: Diagnosis not present

## 2019-06-03 DIAGNOSIS — I10 Essential (primary) hypertension: Secondary | ICD-10-CM | POA: Diagnosis not present

## 2019-06-03 DIAGNOSIS — Z87891 Personal history of nicotine dependence: Secondary | ICD-10-CM | POA: Insufficient documentation

## 2019-06-03 DIAGNOSIS — I6782 Cerebral ischemia: Secondary | ICD-10-CM | POA: Insufficient documentation

## 2019-06-03 DIAGNOSIS — M545 Low back pain: Secondary | ICD-10-CM | POA: Insufficient documentation

## 2019-06-03 DIAGNOSIS — E039 Hypothyroidism, unspecified: Secondary | ICD-10-CM | POA: Diagnosis not present

## 2019-06-03 LAB — COMPREHENSIVE METABOLIC PANEL
ALT: 28 U/L (ref 0–44)
AST: 36 U/L (ref 15–41)
Albumin: 4.5 g/dL (ref 3.5–5.0)
Alkaline Phosphatase: 55 U/L (ref 38–126)
Anion gap: 11 (ref 5–15)
BUN: 16 mg/dL (ref 8–23)
CO2: 24 mmol/L (ref 22–32)
Calcium: 9.6 mg/dL (ref 8.9–10.3)
Chloride: 103 mmol/L (ref 98–111)
Creatinine, Ser: 0.96 mg/dL (ref 0.44–1.00)
GFR calc Af Amer: >60 mL/min (ref >60)
GFR calc non Af Amer: 57 mL/min — ABNORMAL LOW (ref >60)
Glucose, Bld: 120 mg/dL — ABNORMAL HIGH (ref 70–99)
Potassium: 4.1 mmol/L (ref 3.5–5.1)
Sodium: 138 mmol/L (ref 135–145)
Total Bilirubin: 0.6 mg/dL (ref 0.3–1.2)
Total Protein: 7.1 g/dL (ref 6.5–8.1)

## 2019-06-03 LAB — CBC WITH DIFFERENTIAL/PLATELET
Abs Immature Granulocytes: 0.06 10*3/uL (ref 0.00–0.07)
Basophils Absolute: 0.1 10*3/uL (ref 0.0–0.1)
Basophils Relative: 1 %
Eosinophils Absolute: 0.2 10*3/uL (ref 0.0–0.5)
Eosinophils Relative: 2 %
HCT: 37.8 % (ref 36.0–46.0)
Hemoglobin: 12.8 g/dL (ref 12.0–15.0)
Immature Granulocytes: 1 %
Lymphocytes Relative: 27 %
Lymphs Abs: 2.3 10*3/uL (ref 0.7–4.0)
MCH: 36.5 pg — ABNORMAL HIGH (ref 26.0–34.0)
MCHC: 33.9 g/dL (ref 30.0–36.0)
MCV: 107.7 fL — ABNORMAL HIGH (ref 80.0–100.0)
Monocytes Absolute: 0.6 10*3/uL (ref 0.1–1.0)
Monocytes Relative: 7 %
Neutro Abs: 5.3 10*3/uL (ref 1.7–7.7)
Neutrophils Relative %: 62 %
Platelets: 175 10*3/uL (ref 150–400)
RBC: 3.51 MIL/uL — ABNORMAL LOW (ref 3.87–5.11)
RDW: 14.6 % (ref 11.5–15.5)
WBC: 8.5 10*3/uL (ref 4.0–10.5)
nRBC: 0 % (ref 0.0–0.2)

## 2019-06-03 MED ORDER — BRIMONIDINE TARTRATE 0.15 % OP SOLN
1.0000 [drp] | Freq: Three times a day (TID) | OPHTHALMIC | Status: DC
  Administered 2019-06-04: 1 [drp] via OPHTHALMIC
  Filled 2019-06-03: qty 5

## 2019-06-03 MED ORDER — HYDROCODONE-ACETAMINOPHEN 5-325 MG PO TABS
1.0000 | ORAL_TABLET | Freq: Once | ORAL | Status: AC
  Administered 2019-06-03: 1 via ORAL
  Filled 2019-06-03: qty 1

## 2019-06-03 MED ORDER — BRINZOLAMIDE 1 % OP SUSP
1.0000 [drp] | Freq: Three times a day (TID) | OPHTHALMIC | Status: DC
  Administered 2019-06-04: 1 [drp] via OPHTHALMIC
  Filled 2019-06-03: qty 10

## 2019-06-03 MED ORDER — ONDANSETRON 4 MG PO TBDP
4.0000 mg | ORAL_TABLET | Freq: Once | ORAL | Status: AC
  Administered 2019-06-03: 4 mg via ORAL
  Filled 2019-06-03: qty 1

## 2019-06-03 MED ORDER — METHOCARBAMOL 1000 MG/10ML IJ SOLN
500.0000 mg | Freq: Once | INTRAVENOUS | Status: AC
  Administered 2019-06-03: 500 mg via INTRAVENOUS
  Filled 2019-06-03: qty 5

## 2019-06-03 MED ORDER — METHOCARBAMOL 1000 MG/10ML IJ SOLN
500.0000 mg | Freq: Once | INTRAMUSCULAR | Status: DC
  Filled 2019-06-03 (×2): qty 5

## 2019-06-03 MED ORDER — IOHEXOL 300 MG/ML  SOLN
100.0000 mL | Freq: Once | INTRAMUSCULAR | Status: AC | PRN
  Administered 2019-06-03: 100 mL via INTRAVENOUS

## 2019-06-03 MED ORDER — TIMOLOL MALEATE 0.5 % OP SOLN: 1.0000 [drp] | Freq: Every day | OPHTHALMIC | Status: DC

## 2019-06-03 MED ORDER — METHOCARBAMOL 1000 MG/10ML IJ SOLN
1000.0000 mg | Freq: Once | INTRAVENOUS | Status: DC
  Filled 2019-06-03: qty 10

## 2019-06-03 NOTE — ED Notes (Signed)
Pt c/o right hip pain and new bruising to chest.

## 2019-06-03 NOTE — ED Notes (Signed)
Pt states that she now has R hip pain, stated that it came on suddenly and was not there when she arrived, triage RN notified.
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
## 2019-06-04 NOTE — ED Notes (Signed)
Patient verbalizes understanding of discharge instructions. Opportunity for questioning and answers were provided. Armband removed by staff, pt discharged from ED. Pt. ambulatory and discharged home.  
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
## 2019-07-26 DIAGNOSIS — A0472 Enterocolitis due to Clostridium difficile, not specified as recurrent: Secondary | ICD-10-CM | POA: Diagnosis not present

## 2019-07-26 DIAGNOSIS — E039 Hypothyroidism, unspecified: Secondary | ICD-10-CM

## 2019-07-26 DIAGNOSIS — K219 Gastro-esophageal reflux disease without esophagitis: Secondary | ICD-10-CM | POA: Diagnosis not present

## 2019-07-26 DIAGNOSIS — N390 Urinary tract infection, site not specified: Secondary | ICD-10-CM | POA: Diagnosis not present

## 2019-07-26 DIAGNOSIS — I10 Essential (primary) hypertension: Secondary | ICD-10-CM | POA: Diagnosis not present

## 2019-07-27 DIAGNOSIS — N390 Urinary tract infection, site not specified: Secondary | ICD-10-CM | POA: Diagnosis not present

## 2019-07-27 DIAGNOSIS — A0472 Enterocolitis due to Clostridium difficile, not specified as recurrent: Secondary | ICD-10-CM | POA: Diagnosis not present

## 2019-07-27 DIAGNOSIS — K219 Gastro-esophageal reflux disease without esophagitis: Secondary | ICD-10-CM | POA: Diagnosis not present

## 2019-07-27 DIAGNOSIS — I10 Essential (primary) hypertension: Secondary | ICD-10-CM | POA: Diagnosis not present

## 2019-07-28 DIAGNOSIS — N390 Urinary tract infection, site not specified: Secondary | ICD-10-CM | POA: Diagnosis not present

## 2019-07-28 DIAGNOSIS — I10 Essential (primary) hypertension: Secondary | ICD-10-CM | POA: Diagnosis not present

## 2019-07-28 DIAGNOSIS — K219 Gastro-esophageal reflux disease without esophagitis: Secondary | ICD-10-CM | POA: Diagnosis not present

## 2019-07-28 DIAGNOSIS — A0472 Enterocolitis due to Clostridium difficile, not specified as recurrent: Secondary | ICD-10-CM | POA: Diagnosis not present

## 2019-07-29 DIAGNOSIS — A0472 Enterocolitis due to Clostridium difficile, not specified as recurrent: Secondary | ICD-10-CM | POA: Diagnosis not present

## 2019-07-29 DIAGNOSIS — K219 Gastro-esophageal reflux disease without esophagitis: Secondary | ICD-10-CM | POA: Diagnosis not present

## 2019-07-29 DIAGNOSIS — I10 Essential (primary) hypertension: Secondary | ICD-10-CM | POA: Diagnosis not present

## 2019-07-29 DIAGNOSIS — N390 Urinary tract infection, site not specified: Secondary | ICD-10-CM | POA: Diagnosis not present

## 2019-07-30 DIAGNOSIS — I10 Essential (primary) hypertension: Secondary | ICD-10-CM | POA: Diagnosis not present

## 2019-07-30 DIAGNOSIS — N390 Urinary tract infection, site not specified: Secondary | ICD-10-CM | POA: Diagnosis not present

## 2019-07-30 DIAGNOSIS — K219 Gastro-esophageal reflux disease without esophagitis: Secondary | ICD-10-CM | POA: Diagnosis not present

## 2019-07-30 DIAGNOSIS — A0472 Enterocolitis due to Clostridium difficile, not specified as recurrent: Secondary | ICD-10-CM | POA: Diagnosis not present
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
